# Patient Record
Sex: Male | Born: 1965 | Race: White | Hispanic: No | Marital: Married | Smoking: Current every day smoker
Health system: Southern US, Community
[De-identification: ages and names within clinical notes are randomized; demographics above are authoritative.]

## PROBLEM LIST (undated history)

## (undated) DIAGNOSIS — K635 Polyp of colon: Secondary | ICD-10-CM

## (undated) DIAGNOSIS — E785 Hyperlipidemia, unspecified: Secondary | ICD-10-CM

## (undated) DIAGNOSIS — N410 Acute prostatitis: Secondary | ICD-10-CM

## (undated) DIAGNOSIS — K648 Other hemorrhoids: Secondary | ICD-10-CM

## (undated) DIAGNOSIS — G5603 Carpal tunnel syndrome, bilateral upper limbs: Secondary | ICD-10-CM

## (undated) DIAGNOSIS — M1990 Unspecified osteoarthritis, unspecified site: Secondary | ICD-10-CM

## (undated) DIAGNOSIS — G43909 Migraine, unspecified, not intractable, without status migrainosus: Secondary | ICD-10-CM

## (undated) DIAGNOSIS — T7840XA Allergy, unspecified, initial encounter: Secondary | ICD-10-CM

## (undated) DIAGNOSIS — J189 Pneumonia, unspecified organism: Secondary | ICD-10-CM

## (undated) HISTORY — DX: Pneumonia, unspecified organism: J18.9

## (undated) HISTORY — DX: Hyperlipidemia, unspecified: E78.5

## (undated) HISTORY — DX: Allergy, unspecified, initial encounter: T78.40XA

## (undated) HISTORY — DX: Acute prostatitis: N41.0

## (undated) HISTORY — PX: SHOULDER OPEN ROTATOR CUFF REPAIR: SHX2407

## (undated) HISTORY — DX: Other hemorrhoids: K64.8

## (undated) HISTORY — PX: SPINE SURGERY: SHX786

## (undated) HISTORY — PX: APPENDECTOMY: SHX54

## (undated) HISTORY — PX: CARPAL TUNNEL WITH CUBITAL TUNNEL: SHX5608

## (undated) HISTORY — PX: TONSILLECTOMY: SUR1361

## (undated) HISTORY — DX: Unspecified osteoarthritis, unspecified site: M19.90

## (undated) HISTORY — DX: Polyp of colon: K63.5

---
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
## 1989-03-17 HISTORY — PX: SHOULDER OPEN ROTATOR CUFF REPAIR: SHX2407

## 1998-10-18 ENCOUNTER — Ambulatory Visit (HOSPITAL_COMMUNITY): Admission: RE | Admit: 1998-10-18 | Discharge: 1998-10-18 | Payer: Self-pay | Admitting: Orthopedic Surgery

## 1998-10-18 ENCOUNTER — Encounter: Payer: Self-pay | Admitting: Orthopedic Surgery

## 2000-08-20 ENCOUNTER — Ambulatory Visit (HOSPITAL_BASED_OUTPATIENT_CLINIC_OR_DEPARTMENT_OTHER): Admission: RE | Admit: 2000-08-20 | Discharge: 2000-08-20 | Payer: Self-pay | Admitting: Oral Surgery

## 2000-08-20 ENCOUNTER — Encounter (INDEPENDENT_AMBULATORY_CARE_PROVIDER_SITE_OTHER): Payer: Self-pay | Admitting: *Deleted

## 2002-03-04 ENCOUNTER — Encounter: Admission: RE | Admit: 2002-03-04 | Discharge: 2002-03-04 | Payer: Self-pay | Admitting: Orthopedic Surgery

## 2002-03-04 ENCOUNTER — Encounter: Payer: Self-pay | Admitting: Orthopedic Surgery

## 2002-03-23 ENCOUNTER — Emergency Department (HOSPITAL_COMMUNITY): Admission: EM | Admit: 2002-03-23 | Discharge: 2002-03-23 | Payer: Self-pay | Admitting: Emergency Medicine

## 2002-03-23 ENCOUNTER — Encounter: Payer: Self-pay | Admitting: Emergency Medicine

## 2004-06-28 ENCOUNTER — Ambulatory Visit: Payer: Self-pay | Admitting: Internal Medicine

## 2005-07-01 ENCOUNTER — Emergency Department (HOSPITAL_COMMUNITY): Admission: EM | Admit: 2005-07-01 | Discharge: 2005-07-01 | Payer: Self-pay | Admitting: Family Medicine

## 2006-03-16 ENCOUNTER — Ambulatory Visit: Payer: Self-pay | Admitting: Internal Medicine

## 2006-03-16 ENCOUNTER — Encounter: Admission: RE | Admit: 2006-03-16 | Discharge: 2006-03-16 | Payer: Self-pay | Admitting: Internal Medicine

## 2006-11-22 ENCOUNTER — Ambulatory Visit: Payer: Self-pay | Admitting: Internal Medicine

## 2007-02-26 ENCOUNTER — Ambulatory Visit: Payer: Self-pay | Admitting: Internal Medicine

## 2007-02-26 LAB — CONVERTED CEMR LAB
ALT: 31 units/L (ref 0–53)
Bilirubin Urine: NEGATIVE
Bilirubin, Direct: 0.1 mg/dL (ref 0.0–0.3)
Calcium: 9.2 mg/dL (ref 8.4–10.5)
Cholesterol: 229 mg/dL (ref 0–200)
Direct LDL: 131.4 mg/dL
Eosinophils Absolute: 0.3 10*3/uL (ref 0.0–0.6)
Eosinophils Relative: 3.9 % (ref 0.0–5.0)
GFR calc Af Amer: 120 mL/min
GFR calc non Af Amer: 99 mL/min
Glucose, Bld: 90 mg/dL (ref 70–99)
HDL: 33.3 mg/dL — ABNORMAL LOW (ref >39.0)
Ketones, urine, test strip: NEGATIVE
Lymphocytes Relative: 18.2 % (ref 12.0–46.0)
MCV: 98.6 fL (ref 78.0–100.0)
Monocytes Relative: 8.3 % (ref 3.0–11.0)
Neutro Abs: 6 10*3/uL (ref 1.4–7.7)
Platelets: 270 10*3/uL (ref 150–400)
Specific Gravity, Urine: 1.01
TSH: 1.22 microintl units/mL (ref 0.35–5.50)
pH: 7.5

## 2007-02-27 DIAGNOSIS — J309 Allergic rhinitis, unspecified: Secondary | ICD-10-CM

## 2007-03-28 ENCOUNTER — Ambulatory Visit: Payer: Self-pay | Admitting: Internal Medicine

## 2007-03-28 DIAGNOSIS — K219 Gastro-esophageal reflux disease without esophagitis: Secondary | ICD-10-CM

## 2007-03-28 DIAGNOSIS — N410 Acute prostatitis: Secondary | ICD-10-CM | POA: Insufficient documentation

## 2007-03-28 DIAGNOSIS — K648 Other hemorrhoids: Secondary | ICD-10-CM | POA: Insufficient documentation

## 2007-03-28 DIAGNOSIS — M1990 Unspecified osteoarthritis, unspecified site: Secondary | ICD-10-CM | POA: Insufficient documentation

## 2007-03-28 HISTORY — DX: Other hemorrhoids: K64.8

## 2007-03-28 HISTORY — DX: Acute prostatitis: N41.0

## 2007-05-31 ENCOUNTER — Ambulatory Visit: Payer: Self-pay | Admitting: Internal Medicine

## 2007-06-03 ENCOUNTER — Ambulatory Visit: Payer: Self-pay | Admitting: Internal Medicine

## 2007-06-03 DIAGNOSIS — G4700 Insomnia, unspecified: Secondary | ICD-10-CM

## 2007-07-09 ENCOUNTER — Ambulatory Visit: Payer: Self-pay | Admitting: Internal Medicine

## 2007-07-09 DIAGNOSIS — L723 Sebaceous cyst: Secondary | ICD-10-CM

## 2007-09-12 ENCOUNTER — Ambulatory Visit: Payer: Self-pay | Admitting: Internal Medicine

## 2007-10-15 ENCOUNTER — Ambulatory Visit: Payer: Self-pay | Admitting: Internal Medicine

## 2007-10-15 DIAGNOSIS — J0100 Acute maxillary sinusitis, unspecified: Secondary | ICD-10-CM | POA: Insufficient documentation

## 2007-10-28 ENCOUNTER — Telehealth: Payer: Self-pay | Admitting: Internal Medicine

## 2007-12-20 ENCOUNTER — Ambulatory Visit: Payer: Self-pay | Admitting: Internal Medicine

## 2008-02-19 ENCOUNTER — Ambulatory Visit: Payer: Self-pay | Admitting: Internal Medicine

## 2008-05-20 ENCOUNTER — Ambulatory Visit: Payer: Self-pay | Admitting: Internal Medicine

## 2008-07-07 ENCOUNTER — Telehealth: Payer: Self-pay | Admitting: Internal Medicine

## 2008-07-14 ENCOUNTER — Ambulatory Visit: Payer: Self-pay | Admitting: Family Medicine

## 2008-07-17 DIAGNOSIS — G43909 Migraine, unspecified, not intractable, without status migrainosus: Secondary | ICD-10-CM

## 2008-07-17 HISTORY — DX: Migraine, unspecified, not intractable, without status migrainosus: G43.909

## 2009-02-22 ENCOUNTER — Observation Stay (HOSPITAL_COMMUNITY): Admission: EM | Admit: 2009-02-22 | Discharge: 2009-02-23 | Payer: Self-pay | Admitting: Neurology

## 2009-02-22 ENCOUNTER — Encounter: Payer: Self-pay | Admitting: Emergency Medicine

## 2009-02-23 ENCOUNTER — Telehealth: Payer: Self-pay | Admitting: *Deleted

## 2009-02-24 ENCOUNTER — Telehealth: Payer: Self-pay | Admitting: Internal Medicine

## 2009-05-17 ENCOUNTER — Telehealth: Payer: Self-pay | Admitting: Internal Medicine

## 2009-05-18 ENCOUNTER — Ambulatory Visit: Payer: Self-pay | Admitting: Internal Medicine

## 2009-05-18 LAB — CONVERTED CEMR LAB
BUN: 2 mg/dL — ABNORMAL LOW (ref 6–23)
Bilirubin Urine: NEGATIVE
Bilirubin, Direct: 0.1 mg/dL (ref 0.0–0.3)
CO2: 26 meq/L (ref 19–32)
Chloride: 98 meq/L (ref 96–112)
Cholesterol: 232 mg/dL — ABNORMAL HIGH (ref 0–200)
Creatinine, Ser: 0.8 mg/dL (ref 0.4–1.5)
Direct LDL: 157.1 mg/dL
Eosinophils Absolute: 0.2 10*3/uL (ref 0.0–0.7)
Glucose, Bld: 76 mg/dL (ref 70–99)
Glucose, Urine, Semiquant: NEGATIVE
HCT: 45 % (ref 39.0–52.0)
Lymphs Abs: 1.5 10*3/uL (ref 0.7–4.0)
MCHC: 35.9 g/dL (ref 30.0–36.0)
MCV: 102.4 fL — ABNORMAL HIGH (ref 78.0–100.0)
Monocytes Absolute: 0.7 10*3/uL (ref 0.1–1.0)
Neutrophils Relative %: 68.5 % (ref 43.0–77.0)
Platelets: 210 10*3/uL (ref 150.0–400.0)
Protein, U semiquant: NEGATIVE
Specific Gravity, Urine: 1.01
TSH: 0.95 microintl units/mL (ref 0.35–5.50)
Total Bilirubin: 0.9 mg/dL (ref 0.3–1.2)
Total Protein: 7.2 g/dL (ref 6.0–8.3)
pH: 7

## 2009-05-24 ENCOUNTER — Ambulatory Visit: Payer: Self-pay | Admitting: Internal Medicine

## 2009-05-24 DIAGNOSIS — R000 Tachycardia, unspecified: Secondary | ICD-10-CM

## 2009-05-24 DIAGNOSIS — R519 Headache, unspecified: Secondary | ICD-10-CM | POA: Insufficient documentation

## 2009-05-24 DIAGNOSIS — R51 Headache: Secondary | ICD-10-CM

## 2009-05-24 DIAGNOSIS — E785 Hyperlipidemia, unspecified: Secondary | ICD-10-CM | POA: Insufficient documentation

## 2009-06-22 ENCOUNTER — Telehealth: Payer: Self-pay | Admitting: Internal Medicine

## 2009-08-19 ENCOUNTER — Ambulatory Visit: Payer: Self-pay | Admitting: Internal Medicine

## 2009-08-19 LAB — CONVERTED CEMR LAB
ALT: 54 units/L — ABNORMAL HIGH (ref 0–53)
Albumin: 4.4 g/dL (ref 3.5–5.2)
Alkaline Phosphatase: 50 units/L (ref 39–117)
Cholesterol: 219 mg/dL — ABNORMAL HIGH (ref 0–200)
HDL: 57.8 mg/dL (ref >39.00)
Total Protein: 7.2 g/dL (ref 6.0–8.3)
VLDL: 19 mg/dL (ref 0.0–40.0)

## 2009-08-26 ENCOUNTER — Ambulatory Visit: Payer: Self-pay | Admitting: Internal Medicine

## 2009-08-26 LAB — CONVERTED CEMR LAB: LDL Goal: 160 mg/dL

## 2009-09-29 ENCOUNTER — Ambulatory Visit: Payer: Self-pay | Admitting: Internal Medicine

## 2009-12-07 ENCOUNTER — Ambulatory Visit: Payer: Self-pay | Admitting: Internal Medicine

## 2009-12-07 DIAGNOSIS — R61 Generalized hyperhidrosis: Secondary | ICD-10-CM

## 2009-12-21 LAB — CONVERTED CEMR LAB
Basophils Absolute: 0 10*3/uL (ref 0.0–0.1)
Basophils Relative: 0.3 % (ref 0.0–3.0)
Eosinophils Absolute: 0.1 10*3/uL (ref 0.0–0.7)
HCT: 44.8 % (ref 39.0–52.0)
Hemoglobin: 16 g/dL (ref 13.0–17.0)
Lymphocytes Relative: 15.1 % (ref 12.0–46.0)
Lymphs Abs: 1.6 10*3/uL (ref 0.7–4.0)
MCHC: 35.6 g/dL (ref 30.0–36.0)
MCV: 99.6 fL (ref 78.0–100.0)
Monocytes Absolute: 0.7 10*3/uL (ref 0.1–1.0)
Neutro Abs: 8.4 10*3/uL — ABNORMAL HIGH (ref 1.4–7.7)
Platelets: 251 10*3/uL (ref 150.0–400.0)
RBC: 4.5 M/uL (ref 4.22–5.81)
RDW: 12.8 % (ref 11.5–14.6)

## 2009-12-23 ENCOUNTER — Ambulatory Visit: Payer: Self-pay | Admitting: Psychology

## 2009-12-30 ENCOUNTER — Ambulatory Visit: Payer: Self-pay | Admitting: Psychology

## 2010-03-08 ENCOUNTER — Ambulatory Visit: Payer: Self-pay | Admitting: Internal Medicine

## 2010-03-08 DIAGNOSIS — M1289 Other specific arthropathies, not elsewhere classified, multiple sites: Secondary | ICD-10-CM

## 2010-03-08 LAB — CONVERTED CEMR LAB
Basophils Absolute: 0 10*3/uL (ref 0.0–0.1)
Chlamydia, Swab/Urine, PCR: NEGATIVE
HCT: 45 % (ref 39.0–52.0)
Hemoglobin: 15.9 g/dL (ref 13.0–17.0)
Lymphs Abs: 1.8 10*3/uL (ref 0.7–4.0)
MCHC: 35.4 g/dL (ref 30.0–36.0)
MCV: 98.6 fL (ref 78.0–100.0)
Monocytes Absolute: 0.6 10*3/uL (ref 0.1–1.0)
Monocytes Relative: 5.9 % (ref 3.0–12.0)
Neutro Abs: 7.7 10*3/uL (ref 1.4–7.7)
Platelets: 261 10*3/uL (ref 150.0–400.0)
RDW: 13.5 % (ref 11.5–14.6)

## 2010-03-09 ENCOUNTER — Telehealth: Payer: Self-pay | Admitting: Internal Medicine

## 2010-03-14 ENCOUNTER — Telehealth: Payer: Self-pay | Admitting: Internal Medicine

## 2010-06-16 ENCOUNTER — Ambulatory Visit: Payer: Self-pay | Admitting: Psychology

## 2010-06-23 ENCOUNTER — Ambulatory Visit: Payer: Self-pay | Admitting: Internal Medicine

## 2010-06-29 ENCOUNTER — Telehealth: Payer: Self-pay | Admitting: Internal Medicine

## 2010-06-29 ENCOUNTER — Ambulatory Visit: Payer: Self-pay | Admitting: Psychology

## 2010-07-14 ENCOUNTER — Ambulatory Visit: Payer: Self-pay | Admitting: Psychology

## 2010-07-21 ENCOUNTER — Ambulatory Visit
Admission: RE | Admit: 2010-07-21 | Discharge: 2010-07-21 | Payer: Self-pay | Source: Home / Self Care | Attending: Internal Medicine | Admitting: Internal Medicine

## 2010-08-16 NOTE — Assessment & Plan Note (Signed)
Summary: 2 MONTH ROV/NJR/PT RSC FROM BMP/CJR   Vital Signs:  Patient profile:   45 year old male Height:      70 inches Weight:      178 pounds BMI:     25.63 Temp:     98.2 degrees F oral Pulse rate:   76 / minute Resp:     14 per minute BP sitting:   130 / 84  (left arm)  Vitals Entered By: Willy Eddy, LPN (Dec 07, 2009 11:20 AM) CC: roa-increased crestor to 2 times a week and started folbee last ov   CC:  roa-increased crestor to 2 times a week and started folbee last ov.  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 Once Daily 2)  Diazepam 10 Mg  Tabs (Diazepam) .... One Po Q Hs 3)  Mirtazapine 30 Mg Tabs (Mirtazapine) .... One By Mouth Daily  At Bedtime For  Sleep 4)  Crestor 20 Mg Tabs (Rosuvastatin Calcium) .... One By Mouth Monday Nights and Thursday Night 5)  Cialis 5 Mg Tabs (Tadalafil) .... One By Mouth Daily As Needed 6)  Verapamil Hcl Cr 240 Mg Xr24h-Cap (Verapamil Hcl) .... One By Mouth Daily 7)  Folbee 2.5-25-1 Mg Tabs  (Folic Acid-Vit B6-Vit B12) .... One By Mouth Daily 8)  Levitra 20 Mg Tabs (Vardenafil Hcl) .... One By Mouth As Directed  Allergies (verified): No Known Drug Allergies  Past History:  Family History: Last updated: 02/27/2007 Family History Other cancer-breast,cervical  Social History: Last updated: 03/28/2007 Occupation:contruction Single Current Smoker  Risk Factors: Smoking Status: current (12/07/2009) Packs/Day: 2.0 (12/07/2009)  Past medical, surgical, family and social histories (including risk factors) reviewed, and no changes noted (except as noted below).  Past Medical History: Reviewed history from 03/28/2007 and no changes required. Allergic rhinitis GERD hemmorhiods smoker Osteoarthritis left rotator cuff  Past Surgical History: Reviewed history from 02/27/2007 and no changes required. Appendectomy Rotator cuff repair  Family History: Reviewed history from 02/27/2007 and no changes required. Family History Other cancer-breast,cervical  Social History: Reviewed history from 03/28/2007 and no changes required. Occupation:contruction Single Current Smoker  Review of Systems  The patient denies anorexia, fever, weight loss, weight gain, vision loss, decreased hearing, hoarseness, chest pain, syncope, dyspnea on exertion, peripheral edema, prolonged cough, headaches, hemoptysis, abdominal pain, melena, hematochezia, severe indigestion/heartburn, hematuria, incontinence, genital sores, muscle weakness, suspicious skin lesions, transient blindness, difficulty walking, depression, unusual weight change, abnormal bleeding, enlarged lymph nodes, angioedema, breast masses, and testicular masses.    Physical Exam  General:  Well-developed,well-nourished,in no acute distress;  alert,appropriate and cooperative throughout examination Head:  Normocephalic and atraumatic without obvious abnormalities. No apparent alopecia or balding. Eyes:  pupils equal and pupils  round.   Ears:  R ear normal and L ear normal.   Nose:  External nasal examination shows no deformity or inflammation. Nasal mucosa are pink and moist without lesions or exudates. Mouth:  good dentition and pharynx pink and moist.   Neck:  No deformities, masses, or tenderness noted. Lungs:  normal respiratory effort and no wheezes.   Heart:  normal rate and regular rhythm.   Abdomen:  soft and non-tender.     Impression & Recommendations:  Problem # 1:  GENERALIZED HYPERHIDROSIS (ICD-780.8)  hold / remove the folbee  Orders: Venipuncture (36644) TLB-CBC Platelet - w/Differential (85025-CBCD) TLB-TSH (Thyroid Stimulating Hormone) (84443-TSH) TLB-T4 (Thyrox), Free (84439-FT4R) TLB-T3, Free (Triiodothyronine) (84481-T3FREE)  Problem # 2:  CLUSTER HEADACHE SYNDROME UNSPECIFIED (ICD-339.00) on verapamil protocol  the intensity has greaqtly reduced  Problem # 3:  PREVENTIVE HEALTH CARE (ICD-V70.0)  stop smoking  Td Booster: Tdap (05/24/2009)   Chol: 219 (08/19/2009)   HDL: 57.80 (08/19/2009)   LDL: DEL (02/26/2007)   TG: 95.0 (08/19/2009) TSH: 0.95 (05/18/2009)    Discussed using sunscreen, use of alcohol, drug use, self testicular exam, routine dental care, routine eye care, routine physical exam, seat belts, multiple vitamins, osteoporosis prevention, adequate calcium intake in diet, and recommendations for immunizations.  Discussed exercise and checking cholesterol.  Discussed gun safety, safe sex, and contraception. Also recommend checking PSA.  Complete Medication List: 1)  Multivitamins Caps (Multiple vitamin) .... Once daily 2)  Diazepam 10 Mg Tabs (Diazepam) .... One po q hs 3)  Mirtazapine 30 Mg Tabs (Mirtazapine) .... One by mouth daily  at bedtime for  sleep 4)  Crestor 20 Mg Tabs (Rosuvastatin calcium) .... One by mouth monday nights and thursday night 5)  Cialis 5 Mg Tabs (Tadalafil) .... One by mouth daily as needed 6)  Verapamil Hcl Cr 240 Mg Xr24h-cap (Verapamil  hcl) .... One by mouth daily 7)  Levitra 20 Mg Tabs (Vardenafil hcl) .... One by mouth as directed  Patient Instructions: 1)  Please schedule a follow-up appointment in 3 months.

## 2010-08-16 NOTE — Assessment & Plan Note (Signed)
Summary: 3 MONTH ROV/NJR   Vital Signs:  Patient profile:   45 year old male Height:      70 inches Weight:      175 pounds BMI:     25.20 Temp:     98.2 degrees F oral Pulse rate:   80 / minute Resp:     14 per minute BP sitting:   120 / 80  (left arm)  Vitals Entered By: Willy Eddy, LPN (March 08, 2010 2:09 PM)  Nutrition Counseling: Patient's BMI is greater than 25 and therefore counseled on weight management options. CC: roa- new issues to discuss with dr Lovell Sheehan Is Patient Diabetic? No   CC:  roa- new issues to discuss with dr Lovell Sheehan.  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 Once Daily 2)  Diazepam 10 Mg  Tabs (Diazepam) .... One Po Q Hs 3)  Mirtazapine 30 Mg Tabs (Mirtazapine) .... One By Mouth Daily  At Bedtime For  Sleep 4)  Crestor 20 Mg Tabs (Rosuvastatin Calcium) .... One By Mouth Monday Nights and Thursday Night 5)  Cialis 5 Mg Tabs  (Tadalafil) .... One By Mouth Daily As Needed 6)  Verapamil Hcl Cr 240 Mg Xr24h-Cap (Verapamil Hcl) .... One By Mouth Daily 7)  Levitra 20 Mg Tabs (Vardenafil Hcl) .... One By Mouth As Directed  Allergies (verified): No Known Drug Allergies  Past History:  Family History: Last updated: 02/27/2007 Family History Other cancer-breast,cervical  Social History: Last updated: 03/28/2007 Occupation:contruction Single Current Smoker  Risk Factors: Smoking Status: current (03/08/2010) Packs/Day: 2.0 (03/08/2010)  Past medical, surgical, family and social histories (including risk factors) reviewed, and no changes noted (except as noted below).  Past Medical History: Reviewed history from 03/28/2007 and no changes required. Allergic rhinitis GERD hemmorhiods smoker Osteoarthritis left rotator cuff  Past Surgical History: Reviewed history from 02/27/2007 and no changes required. Appendectomy Rotator cuff repair  Family History: Reviewed history from 02/27/2007 and no changes required. Family History Other cancer-breast,cervical  Social History: Reviewed history from 03/28/2007 and no changes required. Occupation:contruction Single Current Smoker  Review of Systems  The patient denies anorexia, fever, weight loss, weight gain, vision loss, decreased hearing, hoarseness, chest pain, syncope, dyspnea on exertion, peripheral edema, prolonged cough, headaches, hemoptysis, abdominal pain, melena, hematochezia, severe indigestion/heartburn, hematuria, incontinence, genital sores, muscle weakness, suspicious skin lesions, transient blindness, difficulty walking, depression, unusual weight change, abnormal bleeding, enlarged lymph nodes, angioedema, and breast masses.    Physical Exam  General:  Well-developed,well-nourished,in  no acute distress; alert,appropriate and cooperative throughout examination Head:  Normocephalic and atraumatic without obvious abnormalities. No apparent  alopecia or balding. Eyes:  pupils equal and pupils round.   Ears:  R ear normal and L ear normal.   Nose:  External nasal examination shows no deformity or inflammation. Nasal mucosa are pink and moist without lesions or exudates. Mouth:  good dentition and pharynx pink and moist.   Neck:  No deformities, masses, or tenderness noted. Lungs:  normal respiratory effort and no wheezes.   Heart:  normal rate and regular rhythm.     Impression & Recommendations:  Problem # 1:  OTHER SPECIFIED ARTHROPATHY MULTIPLE SITES (ICD-716.89) Assessment Unchanged  multijoint arthritis possible infectious ( had mild urinary symptoms) Bechets? Lymes? virus? toxic  Orders: TLB-CBC Platelet - w/Differential (85025-CBCD) T-Chlamydia  Probe, urine [phone]) T-Lyme Disease [id number]) T- * Misc. Laboratory test [phone])  Problem # 2:  CLUSTER HEADACHE SYNDROME UNSPECIFIED (ICD-339.00) Assessment: Unchanged stable pattern  Problem # 3:  ALLERGIC RHINITIS (ICD-477.9)  Discussed use of allergy medications and environmental measures.   Complete Medication List: 1)  Multivitamins Caps (Multiple vitamin) .... Once daily 2)  Diazepam 10 Mg Tabs (Diazepam) .... One po q hs 3)  Mirtazapine 30 Mg Tabs (Mirtazapine) .... One by mouth daily  at bedtime for  sleep 4)  Crestor 20 Mg Tabs (Rosuvastatin calcium) .... One by mouth monday nights and thursday night 5)  Cialis 5 Mg Tabs (Tadalafil) .... One by mouth daily as needed 6)  Verapamil Hcl Cr 240 Mg Xr24h-cap (Verapamil hcl) .... One by mouth daily 7)  Levitra 20 Mg Tabs (Vardenafil hcl) .... One by mouth as directed 8)  Doxycycline Hyclate 100 Mg Caps (Doxycycline hyclate) .... One by mouth two times a day 9)  Prednisone (pak) 10 Mg Tabs (Prednisone) .... Take as directed  Patient Instructions: 1)  Please schedule a follow-up appointment in 2 months. Prescriptions: PREDNISONE (PAK) 10 MG TABS (PREDNISONE) take as directed  #1 pk x 0    Entered and Authorized by:   Stacie Glaze MD   Signed by:   Stacie Glaze MD on 03/08/2010   Method used:   Electronically to        [facility]. [phone]* (retail)       [address]       [place], [place]  [address]       Ph: [phone]       Fax: [phone]   RxID:   [id number] DOXYCYCLINE HYCLATE 100 MG CAPS (DOXYCYCLINE HYCLATE) one by mouth two times a day  #42 x 0   Entered and Authorized by:   Stacie Glaze MD   Signed by:   Stacie Glaze MD on 03/08/2010   Method used:   Electronically to        [facility]. [phone]* (retail)       [address]       [place], [place]  [address]       Ph: [phone]       Fax: [phone]   RxID:   [id number]   Appended Document: Orders Update    Clinical Lists Changes  Orders: Added new Service order of Venipuncture (76283) - Signed Added new Service order of Specimen Handling (15176) - Signed

## 2010-08-16 NOTE — Assessment & Plan Note (Signed)
Summary: 3 MTH F/U // RS   Vital Signs:  Patient profile:   45 year old male Height:      70 inches Weight:      176 pounds BMI:     25.34 Temp:     98.2 degrees F oral Pulse rate:   76 / minute Resp:     14 per minute BP sitting:   110 / 70  (left arm)  Vitals Entered By: Willy Eddy, LPN (June 23, 2010 9:34 AM) CC: roa, URI symptoms Is Patient Diabetic? No   Primary Care Provider:  Stacie Glaze MD  CC:  roa and URI symptoms.  History of Present Illness: Has a sinus and chest cold thta has lasted r over one week and is concerned for bronchitis  URI Symptoms      This is a 45 year old man who presents with URI symptoms.  The patient reports nasal congestion, clear nasal discharge, and productive cough, but denies purulent nasal discharge, sore throat, dry cough, earache, and sick contacts.  The patient denies fever, low-grade fever (<100.5 degrees), fever of 100.5-103 degrees, fever of 103.1-104 degrees, fever to >104 degrees, stiff neck, dyspnea, wheezing, rash, vomiting, diarrhea, use of an antipyretic, and response to antipyretic.  The patient also reports itchy watery eyes, sneezing, and headache.    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) 6)  Acute Bronchitis  (ICD-466.0) 7)  Acute Maxillary Sinusitis  (ICD-461.0) 8)  Sebaceous Cyst, Infected  (ICD-706.2) 9)  Insomnia, Chronic  (ICD-307.42) 10)  Prostatitis, Acute, Chronic  (ICD-601.0) 11)  Osteoarthritis  (ICD-715.90) 12)  Preventive Health Care  (ICD-V70.0) 13)  Gerd  (ICD-530.81) 14)  Hemorrhoids,  Internal  (ICD-455.0) 15)  Allergic Rhinitis  (ICD-477.9)  Current Problems (verified): 1)  Other Specified Arthropathy Multiple Sites  (ICD-716.89) 2)  Generalized Hyperhidrosis  (ICD-780.8) 3)  Tachycardia  (ICD-785.0) 4)  Cluster Headache Syndrome Unspecified  (ICD-339.00) 5)  Hyperlipidemia, 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 Once Daily 2)  Diazepam 10 Mg  Tabs (Diazepam) .... One Po Q Hs 3)  Mirtazapine 30 Mg Tabs (Mirtazapine) .... One By Mouth Daily  At Bedtime For  Sleep 4)  Crestor 20 Mg Tabs (Rosuvastatin Calcium) .... One By Mouth Monday Nights and Thursday Night 5)  Cialis 5 Mg Tabs (Tadalafil) .... One By Mouth Daily As Needed 6)  Verapamil Hcl Cr 240 Mg Xr24h-Cap (Verapamil Hcl) .... One By Mouth Daily 7)  Levitra 20 Mg Tabs (Vardenafil Hcl) .... One By Mouth As Directed 8)  Doxycycline Hyclate 100 Mg Caps (Doxycycline Hyclate) .... One By Mouth Two Times A Day 9)  Prednisone (Pak) 10 Mg Tabs (Prednisone) .... Take As Directed  Current Medications (verified): 1)  Multivitamins   Caps (Multiple Vitamin) .... Once Daily 2)  Diazepam 10 Mg  Tabs (Diazepam) .... One Po Q Hs 3)  Mirtazapine 30 Mg Tabs (Mirtazapine) .... One By Mouth Daily  At Bedtime For  Sleep 4)  Crestor 20 Mg Tabs (Rosuvastatin Calcium) .... One By Mouth Monday Nights and Thursday Night 5)  Cialis 5 Mg Tabs (Tadalafil) .... One By Mouth Daily As Needed 6)  Levitra 20 Mg Tabs (Vardenafil Hcl) .... 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  Past Medical History: Reviewed history from 03/28/2007 and no changes required. Allergic rhinitis GERD hemmorhiods smoker Osteoarthritis left rotator cuff  Past Surgical History: Reviewed history from 02/27/2007 and no changes required. Appendectomy Rotator cuff repair  Family History: Reviewed history from 02/27/2007 and no changes required. Family History Other cancer-breast,cervical  Social History: Reviewed history from 03/28/2007 and no changes required. Occupation:contruction Single Current Smoker  Review of Systems  The patient denies anorexia, fever, weight loss, weight gain, vision loss, decreased hearing, hoarseness, chest pain, syncope, dyspnea on exertion, peripheral edema, prolonged cough, headaches, hemoptysis, abdominal pain, melena, hematochezia, severe indigestion/heartburn, hematuria, incontinence, genital sores, muscle weakness, suspicious skin lesions, transient blindness, difficulty walking, depression, unusual weight change, abnormal bleeding, enlarged lymph nodes, angioedema, and breast masses.    Physical Exam  General:  Well-developed,well-nourished,in no acute distress; alert,appropriate and cooperative throughout examination Head:  Normocephalic and atraumatic without obvious abnormalities. No apparent alopecia or balding. Eyes:  pupils equal and pupils round.   Ears:  R ear normal and L ear normal.   Nose:  External nasal examination shows no deformity or inflammation. Nasal mucosa are pink and moist without lesions or  exudates. Mouth:  good dentition and pharynx pink and moist.   Neck:  No deformities, masses, or tenderness noted. Lungs:  normal respiratory effort and R wheezes.   Heart:  normal rate and regular rhythm.   Abdomen:  soft and non-tender.   Msk:  normal ROM, no joint tenderness, and no joint swelling.   Extremities:  No clubbing, cyanosis, edema, or deformity noted with normal full range of motion of all joints.   Neurologic:  No cranial nerve deficits noted. Station and gait are normal. Plantar reflexes are down-going bilaterally. DTRs are symmetrical throughout. Sensory, motor and coordinative functions appear intact.   Impression & Recommendations:  Problem # 1:  CLUSTER HEADACHE SYNDROME UNSPECIFIED (ICD-339.00) stable pattern  Problem # 2:  HYPERLIPIDEMIA, TYPE IV (ICD-272.4)  His updated medication list for this problem includes:    Crestor 20 Mg Tabs (Rosuvastatin calcium) ..... One by mouth monday nights and thursday night  Labs Reviewed: SGOT: 28 (08/19/2009)   SGPT: 54 (08/19/2009)  Lipid Goals: Chol Goal: 200 (08/26/2009)   HDL Goal: 40 (08/26/2009)   LDL Goal: 160 (08/26/2009)   TG Goal: 150 (08/26/2009)  Prior 10 Yr Risk Heart Disease: Not enough information (08/26/2009)   HDL:57.80 (08/19/2009), 49.00 (05/18/2009)  LDL:DEL (02/26/2007)  Chol:219 (08/19/2009), 232 (05/18/2009)  Trig:95.0 (08/19/2009), 177.0 (05/18/2009)  Problem # 3:  ACUTE BRONCHITIS (ICD-466.0) Assessment: Deteriorated  recurrent  infections  has been sick for over 8 days i feel that this is near to pnemonia and definitely he has risk for copd   The following medications were removed from the medication list:    Doxycycline Hyclate 100 Mg Caps (Doxycycline hyclate) ..... One by mouth two times a day His updated medication list for this problem includes:    Amoxicillin-pot Clavulanate 875-125 Mg Tabs (Amoxicillin-pot clavulanate) ..... One by mouth two times a day    Bromatan Plus 45-3.5-30  Mg/42ml Susp (Pse tan-dexchlor tan-dm tan) .Marland Kitchen..Marland Kitchen Two tsp by mouth two times a day (may sub for similar)    Ventolin Hfa 108 (90 Base) Mcg/act Aers (Albuterol sulfate) .Marland Kitchen..Marland Kitchen Two puff twice a day or every 6 hours as needed  Take antibiotics and other medications as directed. Encouraged to push clear liquids, get enough rest, and take acetaminophen as needed. To be seen in 5-7 days if no improvement, sooner if worse.  Problem # 4:  GERD (ICD-530.81)  Labs Reviewed: Hgb: 15.9 (03/08/2010)   Hct: 45.0 (03/08/2010)  Complete Medication List: 1)  Multivitamins Caps (Multiple vitamin) .... Once daily 2)  Diazepam 10 Mg Tabs (Diazepam) .... One po q hs 3)  Mirtazapine 30 Mg Tabs (Mirtazapine) .... One by mouth daily  at bedtime for  sleep 4)  Crestor 20 Mg Tabs (Rosuvastatin calcium) .... One by mouth monday nights and thursday night 5)  Cialis 5 Mg Tabs (Tadalafil) .... One by mouth daily as needed 6)  Levitra 20 Mg Tabs (Vardenafil hcl) .... One by mouth as directed 7)  Amoxicillin-pot Clavulanate 875-125 Mg Tabs (Amoxicillin-pot clavulanate) .... One by mouth two times a day 8)  Bromatan Plus 45-3.5-30 Mg/3ml Susp (Pse tan-dexchlor tan-dm tan) .... Two tsp by mouth two times a day (may sub for similar) 9)  Ventolin Hfa 108 (90 Base) Mcg/act Aers (Albuterol sulfate) .... Two puff twice a day or every 6 hours as needed  Patient Instructions: 1)  Please schedule a follow-up appointment in 3 months. 2)  BMP prior to visit, ICD-9:  995.20 3)  Hepatic Panel prior to visit, ICD-9: 4)  Lipid Panel prior to visit, ICD-9:272.4 Prescriptions: BROMATAN PLUS 45-3.5-30 MG/5ML SUSP (PSE TAN-DEXCHLOR TAN-DM TAN) two tsp by mouth two times a day (may sub for similar)  #6 oz x 0   Entered and Authorized by:   Stacie Glaze MD   Signed by:   Stacie Glaze MD on 06/23/2010   Method used:   Electronically to        [facility]. [phone]* (retail)       [address]       [place], [place]  [address]       Ph: [phone]       Fax: [phone]   RxID:   [id number] AMOXICILLIN-POT CLAVULANATE 875-125 MG TABS (AMOXICILLIN-POT CLAVULANATE) one by mouth two times a day  #20 x 0   Entered and Authorized by:   Stacie Glaze MD   Signed by:   Stacie Glaze MD on 06/23/2010   Method used:   Electronically to        [facility]. [phone]* (retail)       [address]       [place], [place]  [address]       Ph: [phone]       Fax: [phone]  RxID:   [id number]    Orders Added: 1)  Est. Patient Level IV [14782]

## 2010-08-16 NOTE — Progress Notes (Signed)
Summary: Pt lost his Doxycycline. Pls call in new script  Phone Note Call from Patient Call back at [phone] cell   Caller: Patient Summary of Call: Pt called and said that he lost his bottle of Doxycycline. Pt can not find it anywhere. Pt req that a new scripts be called in asap to [facility] on [address].    Initial call taken by: Lucy Antigua,  March 09, 2010 3:59 PM    Prescriptions: DOXYCYCLINE HYCLATE 100 MG CAPS (DOXYCYCLINE HYCLATE) one by mouth two times a day  #42 x 0   Entered by:   Willy Eddy, LPN   Authorized by:   Stacie Glaze MD   Signed by:   Willy Eddy, LPN on 09/81/1914   Method used:   Electronically to        [facility]. [phone]* (retail)       [address]       [place], [place]  [address]       Ph: [phone]       Fax: [phone]   RxID:   [id number]

## 2010-08-16 NOTE — Assessment & Plan Note (Signed)
Summary: 3 month rov/njr [facility] BMP/NJR   Vital Signs:  Patient profile:   45 year old male Height:      70 inches Weight:      168 pounds BMI:     24.19 Temp:     98.2 degrees F oral Pulse rate:   80 / minute Resp:     14 per minute BP sitting:   124 / 70  (left arm)  Vitals Entered By: Willy Eddy, LPN (August 26, 2009 9:51 AM) CC: [facility], Lipid Management   CC:  [facility] and Lipid Management.  History of Present Illness: follow up  lipids increased patter on cluster head with hx  of hospitalizaton for severe head aches and cpomplete workup failed topramax and propranolol  Lipid Management History:      Positive NCEP/ATP III risk factors include current tobacco user.  Negative NCEP/ATP III risk factors include male age less than 53 years old.     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) 4)  Acute Bronchitis  (ICD-466.0) 5)  Acute Maxillary Sinusitis  (ICD-461.0) 6)  Sebaceous Cyst, Infected  (ICD-706.2) 7)  Insomnia, Chronic  (ICD-307.42) 8)  Prostatitis, Acute, Chronic  (ICD-601.0) 9)  Osteoarthritis  (ICD-715.90) 10)  Preventive Health Care  (ICD-V70.0) 11)  Gerd  (ICD-530.81) 12)  Hemorrhoids, Internal  (ICD-455.0) 13)  Allergic Rhinitis  (ICD-477.9)  Current Problems (verified): 1)  Tachycardia  (ICD-785.0) 2)  Cluster Headache Syndrome Unspecified  (ICD-339.00) 3)  Hyperlipidemia, 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 Once Daily 2)  Diazepam 10 Mg  Tabs (Diazepam) .... One Po Q Hs 3)  Mirtazapine 30 Mg Tabs (Mirtazapine) .... One By Mouth Daily  At Bedtime For  Sleep 4)  Naprosyn 500 Mg  Tabs (Naproxen) .... One By Mouth Q Hs 5)  Atenolol 25 Mg Tabs (Atenolol) .... One  By Mouth Daily  At Night 6)  Crestor 20 Mg Tabs (Rosuvastatin Calcium) .... One By Mouth Monday Nights 7)  Cialis 5 Mg Tabs (Tadalafil) .... One By Mouth Daily As Needed  Current Medications (verified): 1)  Multivitamins   Caps (Multiple Vitamin) .... Once Daily 2)  Diazepam 10 Mg  Tabs (Diazepam) .... One Po Q Hs 3)  Mirtazapine 30 Mg Tabs (Mirtazapine) .... One By Mouth Daily  At Bedtime For  Sleep 4)  Crestor 20 Mg Tabs (Rosuvastatin Calcium) .... One By Mouth Monday Nights and Thursday Night 5)  Cialis 5 Mg Tabs (Tadalafil) .... One By Mouth Daily As Needed 6)  Verapamil Hcl Cr 180 Mg Cr-Tabs (Verapamil Hcl) .... One By Mouth Daily  Allergies (verified): No Known Drug Allergies  Past History:  Family History: Last updated: 02/27/2007 Family History Other cancer-breast,cervical  Social History: Last updated: 03/28/2007 Occupation:contruction Single Current Smoker  Risk Factors: Smoking Status:  current (08/26/2009) Packs/Day: 2.0 (08/26/2009)  Past medical, surgical, family and social histories (including risk factors) reviewed, and no changes noted (except as noted below).  Past Medical History: Reviewed history from 03/28/2007 and no changes required. Allergic rhinitis GERD hemmorhiods smoker Osteoarthritis left rotator cuff  Past Surgical History: Reviewed history from 02/27/2007 and no changes required. Appendectomy Rotator cuff repair  Family History: Reviewed history from 02/27/2007 and no changes required. Family History Other  cancer-breast,cervical  Social History: Reviewed history from 03/28/2007 and no changes required. Occupation:contruction Single Current Smoker  Review of Systems  The patient denies anorexia, fever, weight loss, weight gain, vision loss, decreased hearing, hoarseness, chest pain, syncope, dyspnea on exertion, peripheral edema, prolonged cough, headaches, hemoptysis, abdominal pain, melena, hematochezia, severe indigestion/heartburn, hematuria, incontinence, genital sores, muscle weakness, suspicious skin lesions, transient blindness, difficulty walking, depression, unusual weight change, abnormal bleeding, enlarged lymph nodes, angioedema, and breast masses.    Physical Exam  General:  Well-developed,well-nourished,in no acute distress; alert,appropriate and cooperative throughout examination Head:  Normocephalic and atraumatic without obvious abnormalities. No apparent alopecia or balding. Eyes:  pupils equal and pupils round.   Ears:  R ear normal and L ear normal.   Nose:  External nasal examination shows no deformity or inflammation. Nasal mucosa are pink and moist without lesions or exudates. Mouth:  good dentition and pharynx pink and moist.   Neck:  No deformities, masses, or tenderness noted. Lungs:  normal respiratory effort and no wheezes.   Heart:  normal rate and regular rhythm.   Abdomen:  soft and non-tender.   Genitalia:  circumcised and no urethral discharge.   Prostate:  no gland enlargement and no nodules.   Msk:  normal ROM, no joint tenderness, and no joint swelling.   Pulses:  R radial normal and L radial normal.   Extremities:  No clubbing, cyanosis, edema, or deformity noted with normal full range of motion of all joints.   Neurologic:  No cranial nerve deficits noted. Station and gait are normal. Plantar reflexes are down-going bilaterally. DTRs are symmetrical throughout. Sensory, motor and coordinative functions appear intact.   Impression &  Recommendations:  Problem # 1:  HYPERLIPIDEMIA, TYPE IV (ICD-272.4)  His updated medication list for this problem includes:    Crestor 20 Mg Tabs (Rosuvastatin calcium) ..... One by mouth monday nights and thursday night  Labs Reviewed: SGOT: 28 (08/19/2009)   SGPT: 54 (08/19/2009)   HDL:57.80 (08/19/2009), 49.00 (05/18/2009)  LDL:DEL (02/26/2007)  Chol:219 (08/19/2009), 232 (05/18/2009)  Trig:95.0 (08/19/2009), 177.0 (05/18/2009)  Problem # 2:  CLUSTER HEADACHE SYNDROME UNSPECIFIED (ICD-339.00) increased head ache pattern due to stress coital head aches  see the discharge summary for the hopsital work up the pt had a trial of topramax and them a BB he hsa MRI and angio and even an LP for work up will try verapamil for cluster head ache per protocol  Complete Medication List: 1)  Multivitamins Caps (Multiple vitamin) .... Once daily 2)  Diazepam 10 Mg Tabs (Diazepam) .... One po q hs 3)  Mirtazapine 30 Mg Tabs (Mirtazapine) .... One by mouth daily  at bedtime for  sleep 4)  Crestor 20 Mg Tabs (Rosuvastatin calcium) .... One by mouth monday nights and thursday night 5)  Cialis 5 Mg Tabs (Tadalafil) .... One by mouth daily as needed 6)  Verapamil Hcl Cr 180 Mg Cr-tabs (Verapamil hcl) .... One by mouth daily  Lipid Assessment/Plan:      Based on NCEP/ATP III, the patient's risk  factor category is "0-1 risk factors".  The patient's lipid goals are as follows: Total cholesterol goal is 200; LDL cholesterol goal is 160; HDL cholesterol goal is 40; Triglyceride goal is 150.    Patient Instructions: 1)  Please schedule a follow-up appointment in 1 month 2)  verapamil in AM and the diazepam in the PM 1/2 to 1. Prescriptions: VERAPAMIL HCL CR 180 MG CR-TABS (VERAPAMIL HCL) one by mouth daily  #30 x 11   Entered and Authorized by:   Stacie Glaze MD   Signed by:   Stacie Glaze MD on 08/26/2009   Method used:   Electronically to        [facility]. [phone]* (retail)       [address]       [place], [place]  [address]       Ph: [phone]       Fax: [phone]   RxID:   [id number]

## 2010-08-16 NOTE — Assessment & Plan Note (Signed)
Summary: 1 month fup//ccm   Vital Signs:  Patient profile:   45 year old male Height:      70 inches Weight:      180 pounds BMI:     25.92 Temp:     98.3 degrees F oral Pulse rate:   76 / minute Resp:     14 per minute BP sitting:   132 / 80  (left arm)  Vitals Entered By: Willy Eddy, LPN (September 29, 2009 10:26 AM)  Nutrition Counseling: Patient's BMI is greater than 25 and therefore counseled on weight management options. CC: [facility]   CC:  [facility].  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 Once Daily 2)  Diazepam 10 Mg  Tabs (Diazepam) .... One Po Q Hs 3)  Mirtazapine 30 Mg Tabs (Mirtazapine) .... One By Mouth Daily  At Bedtime For  Sleep 4)  Crestor 20 Mg Tabs (Rosuvastatin Calcium) .... One By Mouth Monday Nights and Thursday Night 5)  Cialis 5 Mg Tabs (Tadalafil) .... One By Mouth Daily As Needed 6)  Verapamil Hcl Cr 180 Mg Cr-Tabs (Verapamil Hcl) .... One By Mouth Daily  Allergies (verified): No Known Drug Allergies  Past History:  Family History: Last updated: 02/27/2007 Family History Other cancer-breast,cervical  Social  History: Last updated: 03/28/2007 Occupation:contruction Single Current Smoker  Risk Factors: Smoking Status: current (09/29/2009) Packs/Day: 2.0 (09/29/2009)  Past medical, surgical, family and social histories (including risk factors) reviewed, and no changes noted (except as noted below).  Past Medical History: Reviewed history from 03/28/2007 and no changes required. Allergic rhinitis GERD hemmorhiods smoker Osteoarthritis left rotator cuff  Past Surgical History: Reviewed history from 02/27/2007 and no changes required. Appendectomy Rotator cuff repair  Family History: Reviewed history from 02/27/2007 and no changes required. Family History Other cancer-breast,cervical  Social History: Reviewed history from 03/28/2007 and no changes required. Occupation:contruction Single Current Smoker  Review of Systems  The patient denies anorexia, fever, weight loss, weight gain, vision loss, decreased hearing, hoarseness, chest pain, syncope, dyspnea on exertion, peripheral edema, prolonged cough, headaches, hemoptysis, abdominal pain, melena, hematochezia, severe indigestion/heartburn, hematuria, incontinence, genital sores, muscle weakness, suspicious skin lesions, transient blindness, difficulty walking, depression, unusual weight change, abnormal bleeding, enlarged lymph nodes, angioedema, and breast masses.    Physical Exam  General:  Well-developed,well-nourished,in no acute distress; alert,appropriate and cooperative throughout examination Head:  Normocephalic and atraumatic without obvious abnormalities. No apparent alopecia or balding. Eyes:  pupils equal and pupils round.   Ears:  R ear normal and L ear normal.   Nose:  External nasal examination shows no deformity or inflammation. Nasal mucosa are pink and moist without lesions or exudates. Mouth:  good dentition and  pharynx pink and moist.   Neck:  No deformities, masses, or tenderness noted. Lungs:  normal  respiratory effort and no wheezes.   Heart:  normal rate and regular rhythm.     Impression & Recommendations:  Problem # 1:  HYPERLIPIDEMIA, TYPE IV (ICD-272.4) add b6 b12 and folate for lipids His updated medication list for this problem includes:    Crestor 20 Mg Tabs (Rosuvastatin calcium) ..... One by mouth monday nights and thursday night  Labs Reviewed: SGOT: 28 (08/19/2009)   SGPT: 54 (08/19/2009)  Lipid Goals: Chol Goal: 200 (08/26/2009)   HDL Goal: 40 (08/26/2009)   LDL Goal: 160 (08/26/2009)   TG Goal: 150 (08/26/2009)  Prior 10 Yr Risk Heart Disease: Not enough information (08/26/2009)   HDL:57.80 (08/19/2009), 49.00 (05/18/2009)  LDL:DEL (02/26/2007)  Chol:219 (08/19/2009), 232 (05/18/2009)  Trig:95.0 (08/19/2009), 177.0 (05/18/2009)  Problem # 2:  CLUSTER HEADACHE SYNDROME UNSPECIFIED (ICD-339.00) much improved cluster head ache pattern  Problem # 3:  GERD (ICD-530.81)  Labs Reviewed: Hgb: 16.1 (05/18/2009)   Hct: 45.0 (05/18/2009)  Complete Medication List: 1)  Multivitamins Caps (Multiple vitamin) .... Once daily 2)  Diazepam 10 Mg Tabs (Diazepam) .... One po q hs 3)  Mirtazapine 30 Mg Tabs (Mirtazapine) .... One by mouth daily  at bedtime for  sleep 4)  Crestor 20 Mg Tabs (Rosuvastatin calcium) .... One by mouth monday nights and thursday night 5)  Cialis 5 Mg Tabs (Tadalafil) .... One by mouth daily as needed 6)  Verapamil Hcl Cr 240 Mg Xr24h-cap (Verapamil hcl) .... One by mouth daily 7)  Folbee 2.5-25-1 Mg Tabs (Folic acid-vit b6-vit b12) .... One by mouth daily 8)  Levitra 20 Mg Tabs (Vardenafil hcl) .... One by mouth as directed  Patient Instructions: 1)  Please schedule a follow-up appointment in 2 months. Prescriptions: LEVITRA 20 MG TABS (VARDENAFIL HCL) one by mouth as directed  #5 x 11   Entered and Authorized by:   Stacie Glaze MD   Signed by:   Stacie Glaze MD on 09/29/2009   Method used:   Electronically to        [facility].  [phone]* (retail)       [address]       [place], [place]  [address]       Ph: [phone]       Fax: [phone]   RxID:   [id number] VERAPAMIL HCL CR 240 MG XR24H-CAP (VERAPAMIL HCL) one by mouth daily  #30 x 11   Entered and Authorized by:   Stacie Glaze MD   Signed by:   Stacie Glaze MD on 09/29/2009   Method used:   Electronically to        [facility]. [phone]* (retail)       [address]       [place], [place]  [address]       Ph: [phone]       Fax: [phone]   RxID:   [id number] FOLBEE 2.5-25-1 MG TABS (FOLIC ACID-VIT B6-VIT B12) one by mouth daily  #30 x 11   Entered and Authorized by:   Stacie Glaze MD   Signed by:   Stacie Glaze MD on 09/29/2009   Method used:   Electronically to        [facility]. [phone]* (retail)  [address]       [place], [place]  [address]       Ph: [phone]       Fax: [phone]   RxID:   [id number]

## 2010-08-16 NOTE — Progress Notes (Signed)
Summary: bloodwork results  Phone Note Call from Patient Call back at Work Phone [phone]   Caller: Patient Call For: Stacie Glaze MD Summary of Call: pt would like bloodwork results Initial call taken by: Heron Sabins,  March 14, 2010 4:38 PM  Follow-up for Phone Call        pt informed-neg per dr Lovell Sheehan Follow-up by: Willy Eddy, LPN,  March 14, 2010 5:36 PM

## 2010-08-18 NOTE — Assessment & Plan Note (Signed)
Summary: chest congestion/njr   Vital Signs:  Patient profile:   45 year old male Pulse rate:   60 / minute BP sitting:   100 / 80  (left arm)  Vitals Entered By: Kyung Rudd, CMA (July 21, 2010 3:21 PM) CC: pt c/o not feeling any better after dr Lovell Sheehan rxed abx for walking pneumonia...also c/o fatigued   Primary Care Provider:  Stacie Glaze MD  CC:  pt c/o not feeling any better after dr Lovell Sheehan rxed abx for walking pneumonia...also c/o fatigued.  History of Present Illness: Patient presents to clinic as a workin for evaluation of possible sinusitis. States symptoms began over one month ago. C/o nasal and chest congestion with light yellow drainage and productive cough. Notes bilateral maxillary sinus pain but denies teeth pain. No f/c or sweats. S/p augmentin course with persistent symptoms.  Current Medications (verified): 1)  Multivitamins   Caps (Multiple Vitamin) .... Once Daily 2)  Diazepam 10 Mg  Tabs (Diazepam) .... One Po Q Hs 3)  Mirtazapine 30 Mg Tabs (Mirtazapine) .... One By Mouth Daily  At Bedtime For  Sleep 4)  Crestor 20 Mg Tabs (Rosuvastatin Calcium) .... One By Mouth Monday Nights and Thursday Night 5)  Ventolin Hfa 108 (90 Base) Mcg/act Aers (Albuterol Sulfate) .... Two Puff Twice A Day or Every 6 Hours As Needed  Allergies (verified): No Known Drug Allergies  Past History:  Past medical, surgical, family and social histories (including risk factors) reviewed for relevance to current acute and chronic problems.  Past Medical History: Reviewed history from 03/28/2007 and no changes required. Allergic rhinitis GERD hemmorhiods smoker Osteoarthritis left rotator cuff  Past Surgical History: Reviewed history from 02/27/2007 and no changes required. Appendectomy Rotator cuff repair  Family History: Reviewed history from 02/27/2007 and no changes required. Family History Other cancer-breast,cervical  Social History: Reviewed history from  03/28/2007 and no changes required. Occupation:contruction Single Current Smoker  Review of Systems      See HPI General:  Denies chills, fever, and malaise. Eyes:  Denies discharge, eye irritation, eye pain, and red eye. ENT:  Complains of nasal congestion, postnasal drainage, and sinus pressure; denies ear discharge, earache, and hoarseness. Resp:  Complains of cough and sputum productive; denies coughing up blood, shortness of breath, and wheezing. Derm:  Denies changes in color of skin, flushing, and rash.  Physical Exam  General:  Well-developed,well-nourished,in no acute distress; alert,appropriate and cooperative throughout examination Head:  Normocephalic and atraumatic without obvious abnormalities. No apparent alopecia or balding. +tenderness frontal sinus>maxillary sinuses Eyes:  pupils equal, pupils round, corneas and lenses clear, and no injection.   Ears:  External ear exam shows no significant lesions or deformities.  Otoscopic examination reveals clear canals, tympanic membranes are intact bilaterally without bulging, retraction, inflammation or discharge. Hearing is grossly normal bilaterally. Nose:  External nasal examination shows no deformity or inflammation. Nasal mucosa are pink and moist without lesions or exudates. Mouth:  Oral mucosa and oropharynx without lesions or exudates.  Teeth in good repair. Neck:  No deformities, masses, or tenderness noted. Lungs:  Normal respiratory effort, chest expands symmetrically. Lungs are clear to auscultation, no crackles or wheezes. Heart:  Normal rate and regular rhythm. S1 and S2 normal without gallop, murmur, click, rub or other extra sounds. Skin:  turgor normal, color normal, and no rashes.     Impression & Recommendations:  Problem # 1:  COUGH (ICD-786.2) Assessment Deteriorated Given chronicity of cough of cxr pa/lat. Retreat with abx  for possible sinusitis. Followup if no improvement or worsening.  Future  Orders: T-2 View CXR (71020TC) ... 07/22/2010  Complete Medication List: 1)  Multivitamins Caps (Multiple vitamin) .... Once daily 2)  Diazepam 10 Mg Tabs (Diazepam) .... One po q hs 3)  Mirtazapine 30 Mg Tabs (Mirtazapine) .... One by mouth daily  at bedtime for  sleep 4)  Crestor 20 Mg Tabs (Rosuvastatin calcium) .... One by mouth monday nights and thursday night 5)  Ventolin Hfa 108 (90 Base) Mcg/act Aers (Albuterol sulfate) .... Two puff twice a day or every 6 hours as needed 6)  Levaquin 500 Mg Tabs (Levofloxacin) .... One by mouth qd Prescriptions: LEVAQUIN 500 MG TABS (LEVOFLOXACIN) one by mouth qd  #14 x 0   Entered and Authorized by:   Edwyna Perfect MD   Signed by:   Edwyna Perfect MD on 07/21/2010   Method used:   Electronically to        [facility]. [phone]* (retail)       [address]       [place], [place]  [address]       Ph: [phone]       Fax: [phone]   RxID:   [id number]    Orders Added: 1)  T-2 View CXR [71020TC] 2)  Est. Patient Level IV [72536]

## 2010-08-18 NOTE — Progress Notes (Signed)
Summary: REQUEST FOR RX  Phone Note Call from Patient   Caller: Patient   [phone] Reason for Call: Acute Illness, Talk to Nurse Complaint: Breathing Problems Summary of Call: Pt called to speak with Dr Lovell Sheehan / Rushie Goltz, LPN.... Pt states that he thinks his abx has not worked, still exp sxs of chest congestion, sinus pressure, greenish-yellow drainage.... inquiring whether another abx can be sent to pharmacy:  [facility].... Pt was offered OV with another physician but adv he wants to wait and see if Dr Lovell Sheehan will send Rx (abx) into pharmacy.....?  Initial call taken by: Debbra Riding,  June 29, 2010 9:52 AM  Follow-up for Phone Call        per dr Lovell Sheehan- c omplete 10 days of medication as ordered-reassured Follow-up by: Willy Eddy, LPN,  June 29, 2010 10:49 AM

## 2010-09-14 ENCOUNTER — Ambulatory Visit (INDEPENDENT_AMBULATORY_CARE_PROVIDER_SITE_OTHER): Payer: BC Managed Care – PPO | Admitting: Psychology

## 2010-09-14 DIAGNOSIS — F331 Major depressive disorder, recurrent, moderate: Secondary | ICD-10-CM

## 2010-09-28 ENCOUNTER — Other Ambulatory Visit: Payer: Self-pay

## 2010-10-04 ENCOUNTER — Encounter: Payer: Self-pay | Admitting: Internal Medicine

## 2010-10-05 ENCOUNTER — Encounter: Payer: Self-pay | Admitting: Internal Medicine

## 2010-10-23 LAB — CBC
HCT: 42.4 % (ref 39.0–52.0)
Hemoglobin: 14.6 g/dL (ref 13.0–17.0)
MCHC: 34.5 g/dL (ref 30.0–36.0)
MCHC: 34.7 g/dL (ref 30.0–36.0)
MCV: 100.1 fL — ABNORMAL HIGH (ref 78.0–100.0)
RBC: 4.21 MIL/uL — ABNORMAL LOW (ref 4.22–5.81)
RBC: 4.61 MIL/uL (ref 4.22–5.81)
RDW: 12.6 % (ref 11.5–15.5)

## 2010-10-23 LAB — SAMPLE TO BLOOD BANK

## 2010-10-23 LAB — DIFFERENTIAL
Basophils Absolute: 0.1 10*3/uL (ref 0.0–0.1)
Basophils Relative: 0 % (ref 0–1)
Basophils Relative: 1 % (ref 0–1)
Eosinophils Absolute: 0.2 10*3/uL (ref 0.0–0.7)
Monocytes Absolute: 0.6 10*3/uL (ref 0.1–1.0)
Monocytes Absolute: 0.8 10*3/uL (ref 0.1–1.0)
Monocytes Relative: 5 % (ref 3–12)
Monocytes Relative: 8 % (ref 3–12)
Neutro Abs: 12.1 10*3/uL — ABNORMAL HIGH (ref 1.7–7.7)
Neutro Abs: 4.8 10*3/uL (ref 1.7–7.7)
Neutrophils Relative %: 76 % (ref 43–77)

## 2010-10-23 LAB — POCT I-STAT, CHEM 8
Calcium, Ion: 1.03 mmol/L — ABNORMAL LOW (ref 1.12–1.32)
Glucose, Bld: 97 mg/dL (ref 70–99)
HCT: 50 % (ref 39.0–52.0)
Hemoglobin: 17 g/dL (ref 13.0–17.0)
Potassium: 4.3 mEq/L (ref 3.5–5.1)

## 2010-10-23 LAB — CSF CELL COUNT WITH DIFFERENTIAL
RBC Count, CSF: 6100 /mm3 — ABNORMAL HIGH
RBC Count, CSF: 700 /mm3 — ABNORMAL HIGH
Tube #: 1
Tube #: 4
WBC, CSF: 1 /mm3 (ref 0–5)
WBC, CSF: 1 /mm3 (ref 0–5)

## 2010-10-23 LAB — CSF CULTURE W GRAM STAIN
Culture: NO GROWTH
Gram Stain: NONE SEEN

## 2010-10-23 LAB — PROTEIN, CSF: Total  Protein, CSF: 59 mg/dL — ABNORMAL HIGH (ref 15–45)

## 2010-10-23 LAB — GRAM STAIN

## 2010-11-18 ENCOUNTER — Telehealth: Payer: Self-pay | Admitting: Internal Medicine

## 2010-11-18 NOTE — Telephone Encounter (Signed)
Pt needs to get an ov to see Dr Lovell Sheehan re: chest congestion. Pls advise when we can work pt in sch?  Pt is needing to get another order for chest xray. See previous note.

## 2010-11-18 NOTE — Telephone Encounter (Signed)
Pt will need to see another md in facility--they can decided if he needs cxr

## 2010-11-18 NOTE — Telephone Encounter (Signed)
Left vm for pt to sch ov with another dr, as noted below. Waiting on cb.

## 2010-11-18 NOTE — Telephone Encounter (Signed)
Pt rcvd and order from Dr Rodena Medin to get a chest xray done in January 2012, for excessive chest congestion in chest. Pt never went to get xray and pt has lost order. Pt still has chest congestion.  Pt is wondering if Dr Rodena Medin can re-issue order or does pt need to sch another ov to see Dr Lovell Sheehan?

## 2010-11-28 NOTE — Telephone Encounter (Signed)
Pt has been sch to see Dr Rodena Medin re: getting ? cxr done. Pt coming in on 11/29/10, as noted.

## 2010-11-29 ENCOUNTER — Ambulatory Visit (INDEPENDENT_AMBULATORY_CARE_PROVIDER_SITE_OTHER)
Admission: RE | Admit: 2010-11-29 | Discharge: 2010-11-29 | Disposition: A | Discharge status: Home / Self Care | Payer: BC Managed Care – PPO | Source: Ambulatory Visit | Attending: Internal Medicine | Admitting: Internal Medicine

## 2010-11-29 ENCOUNTER — Ambulatory Visit (INDEPENDENT_AMBULATORY_CARE_PROVIDER_SITE_OTHER): Payer: BC Managed Care – PPO | Admitting: Internal Medicine

## 2010-11-29 ENCOUNTER — Encounter: Payer: Self-pay | Admitting: Internal Medicine

## 2010-11-29 DIAGNOSIS — R05 Cough: Secondary | ICD-10-CM

## 2010-11-29 DIAGNOSIS — F17200 Nicotine dependence, unspecified, uncomplicated: Secondary | ICD-10-CM

## 2010-11-29 DIAGNOSIS — R200 Anesthesia of skin: Secondary | ICD-10-CM

## 2010-11-29 DIAGNOSIS — R209 Unspecified disturbances of skin sensation: Secondary | ICD-10-CM

## 2010-11-29 DIAGNOSIS — R053 Chronic cough: Secondary | ICD-10-CM

## 2010-11-29 DIAGNOSIS — K921 Melena: Secondary | ICD-10-CM

## 2010-11-29 LAB — CBC WITH DIFFERENTIAL/PLATELET
Basophils Absolute: 0 10*3/uL (ref 0.0–0.1)
Eosinophils Absolute: 0.2 10*3/uL (ref 0.0–0.7)
HCT: 46.3 % (ref 39.0–52.0)
Hemoglobin: 16.4 g/dL (ref 13.0–17.0)
Lymphocytes Relative: 17 % (ref 12.0–46.0)
Lymphs Abs: 1.5 10*3/uL (ref 0.7–4.0)
MCHC: 35.5 g/dL (ref 30.0–36.0)
Neutro Abs: 6.4 10*3/uL (ref 1.4–7.7)
RDW: 12.8 % (ref 11.5–14.6)

## 2010-11-29 MED ORDER — FLUTICASONE PROPIONATE HFA 110 MCG/ACT IN AERO: 2.0000 | INHALATION_SPRAY | Freq: Two times a day (BID) | RESPIRATORY_TRACT | Status: DC

## 2010-11-29 NOTE — Assessment & Plan Note (Signed)
Obtain CBC. Given abdominal pain, change in bowel habits and blood in stool will proceed with GI consult.

## 2010-11-29 NOTE — Assessment & Plan Note (Signed)
?   Positional and neuropathic. Avoid nsaids currently given GI sx's. Followup with pmd

## 2010-11-29 NOTE — Discharge Summary (Signed)
NAME:  Joshua Manning, Joshua Manning              ACCOUNT NO.:  [id number]   MEDICAL RECORD NO.:  [id number]          PATIENT TYPE:  INP   LOCATION:  3027                         FACILITY:  [facility]   PHYSICIAN:  Pramod P. Pearlean Brownie, MD    DATE OF BIRTH:  February 27, 1966   DATE OF ADMISSION:  02/22/2009  DATE OF DISCHARGE:  02/23/2009                               DISCHARGE SUMMARY   DIAGNOSES AT TIME OF DISCHARGE:  1. Benign postcoital headache.  2. Migraines.  3. Rotator cuff repair.  4. Appendectomy.  5. Cigarette smoker.   MEDICINES AT TIME OF DISCHARGE:  1. Nicotine patch 21 mg a day x6 weeks, 14 mg a day x2 weeks, then 7      mg a day x2 weeks, then stop.  2. Topamax 25 mg one a day x7 days, then increase to b.i.d.  3. Ultram 50 mg one to two tablets every 6 hours p.r.n. headache.  4. Fish oil 1000 mg two tablets twice a day.  5. Multivitamin one a day.   STUDIES PERFORMED:  1. CT of the brain on admission shows no acute abnormality, orbits for      foreign body two-view shows no metallic foreign bodies within the      orbits.  2. MRI of the brain is normal.  3. 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  4. Cerebral angiogram shows a 2-mm conical-shaped outpouch in the      right posterior communicating artery, most likely representing an      infundibulum, possible mild atherosclerotic changes in the left A1      segment.  5. EKG not present in chart, though telemetry strip shows normal sinus      rhythm.   LABORATORY STUDIES:  1. CBC with red blood cells 4.21, MCV 100.7, otherwise, normal.  CSF      cultures show no growth.  CSF cell count shows straw-colored CSF      clear with 700 red blood cells and white blood cells 1.  2. Tube showed red CSF, but was cloudy with 1 white blood cell and      6100 red blood cells.  Gram stain, no organisms seen.  Protein high      at 59, glucose 55.  Coags  normal.  Chemistry with calcium 1.03,      sodium 126, BUN less than 3, creatinine is 0.8.   HISTORY OF PRESENT ILLNESS:  Joshua Manning is a 45 year old right-  handed Caucasian male with history of chronic tobacco abuse and  migraines for last prominent 7 years ago.  The patient had evidence of a  postcoital headache that occurred tonight at point of orgasm.  The  headache started in the occiput and migrated to the frontal region of  his head.  He felt as if head was being squeezed.  Initially, there was  throbbing pain, but the headaches have basically become rebating,  steady, and holocephalic.  The patient has not had significant  nausea.  He does have some photophobia.  He says the pain is different from his  migraines, which he is to have, which were associated with throbbing  with nausea and sensory to light and tender scalp.  The patient also has  decreased range of motion of his neck.   Spinal tap was carried out in the early morning hours of February 22, 2009,  that initially showed 6100 blood cells in the first tube and dropped to  700 in tube forth.  The fluid is clear.  Given the period of time  between onset of symptoms and lumbar puncture being 48 hours, one would  have expected xanthochromia and thus it appeared that this was a  traumatic tap.  CT of the brain was unremarkable.  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  Neurology was consulted for the headache  etiology.   At the time of neurology exam, the patient complained of numbness of the  left side of his face and right-sided weakness.  The patient states he  did not initially go to the emergency room and spent the rest of the day  standing.  Any time he laid down, the pain worsened.  He later called  his sister, who is a paramedic and she recommended to go to the  emergency room.  She states his speech was slurred.  He presented to   [facility] and complained of slight weakness and sweatiness of both  arms.  Initial exam felt his effort was inconsistent.  He was admitted  to the hospital for further evaluation of possible aneurysm.  No signs  of subarachnoid hemorrhage were obvious.   HOSPITAL COURSE:  Cerebral angiogram was done on the following day by  Dr. Julieanne Cotton which was negative for aneurysm.  He did have a 2-  mm infundibulum.  He felt his headache was likely benign postcoital  headache and not a subarachnoid hemorrhage.  He was placed on Topamax  for secondary headache prevention.  It was advised to stop smoking and  started on the nicotine patch.  He will be discharged home and asked not  to have sex for 2 weeks or to drive for 1.  He can follow up with Dr.  Pearlean Brownie in the office in 2 months.   CONDITION ON DISCHARGE:  The patient is alert and oriented x3.  Speech  clear.  No aphasia.  No weakness.  Headache improved, but not resolved.   DISCHARGE PLAN:  1. Discharge home with family.  2. New Topamax for headache prevention.  3. Ultram for breakthrough headache pain.   FOLLOWUP:  1. Dr. Pearlean Brownie as needed in 2 months.  2. Stop smoking.  Nicotine patch prescription given.      Annie Main, N.P.    ______________________________  Sunny Schlein. Pearlean Brownie, MD    SB/MEDQ  D:  02/23/2009  T:  02/24/2009  Job:  [id number]   cc:   Stacie Glaze, MD

## 2010-11-29 NOTE — Assessment & Plan Note (Signed)
Counseled regarding the need for cessation. Pt states understanding and agreement.

## 2010-11-29 NOTE — Progress Notes (Signed)
  Subjective:    Patient ID: Joshua Manning, male    DOB: 12-30-1965, 45 y.o.   MRN: [id number]  HPI Pt presents to clinic for evaluation of chronic cough and blood in stool. Seen Jan 2012 with sinusitis and cough s/p augmentin. Placed on 14 day course of levaquin at the time and resolved sinus sx's but cough persisted. [facility] for cxr. Cough is now primarily non productive without hemoptysis. Continues to smoke tobacco ~2ppd (partial use =~1ppd) and is attempting cessation. Has attempted chantix and wellbutrin in the past.  Notes intermittent wheezing worse at night and mild DOE not improved by albuterol mdi prn.   Notes recent intermittent abdominal discomfort followed by fecal urgency with blood in stool. Has h/o hemorrhoid and relates h/o colonoscopy ?~15 years ago with finding of bengin polyps per pt. No wt loss or sweats.  C/o intermittent nocturnal arm paresthesias that can affect either arm and appear to be positional. Occur at the level of the elbow and lower. No current neck pain or true radicular sx's.  Reviewed pmh, medications and allergies.    Review of Systems  Constitutional: Negative for fever and chills.  Respiratory: Positive for cough, shortness of breath and wheezing.   Gastrointestinal: Positive for abdominal pain, blood in stool and abdominal distention.  Neurological: Positive for numbness. Negative for weakness.       Objective:   Physical Exam  Nursing note and vitals reviewed. Constitutional: He appears well-developed and well-nourished. No distress.  HENT:  Head: Normocephalic and atraumatic.  Right Ear: External ear normal.  Left Ear: External ear normal.  Nose: Nose normal.  Eyes: Conjunctivae are normal. No scleral icterus.  Cardiovascular: Normal rate, regular rhythm and normal heart sounds.  Exam reveals no gallop and no friction rub.   No murmur heard. Pulmonary/Chest: Effort normal and breath sounds normal. No respiratory distress. He has no wheezes.  He has no rales.  Abdominal: Soft. Bowel sounds are normal. He exhibits distension. He exhibits no mass. There is tenderness in the epigastric area. There is guarding. There is no rigidity and no rebound.  Musculoskeletal: Normal range of motion.  Neurological: He is alert.  Skin: Skin is warm and dry. He is not diaphoretic. No erythema.  Psychiatric: He has a normal mood and affect.          Assessment & Plan:

## 2010-11-29 NOTE — Assessment & Plan Note (Signed)
Obtain CXR pa/lat. S/p 2 courses of abx without improvement. Begin flovent and instructed to rinse mouth after use. Smoking cessation recommended. Scheduled f/u with pmd (missed last appt)

## 2010-11-29 NOTE — Consult Note (Signed)
NAMEAVRAM, Joshua Manning              ACCOUNT NO.:  [id number]   MEDICAL RECORD NO.:  [id number]          PATIENT TYPE:  INP   LOCATION:  3027                         FACILITY:  [facility]   PHYSICIAN:  Deanna Artis. Hickling, M.D.DATE OF BIRTH:  10-15-1965   DATE OF CONSULTATION:  02/22/2009  DATE OF DISCHARGE:                                 CONSULTATION   CHIEF COMPLAINT:  Headache.   HISTORY OF PRESENT CONDITION:  The patient is a 45 year old gentleman  with history of chronic tobacco abuse and migraines.  Headaches were  last prominent about 7 years ago (my PA obtained a history that it was  only 2 or 3 years ago).  The patient had evidence of a postcoital  headache.  This started in the occiput and migrated to the frontal  region of his head.  He felt as if his head was being squeezed.  Initially, there was throbbing pain, but the headache has basically  become unremitting steady holocephalic.  The patient has not had  significant nausea.  He does have some photophobia.   He says that the pain is different from his migraines which used to be  throbbing and associated with nausea sensory to light and tender scalp.   The patient has decreased range of motion of his neck.   He had spinal tap carried out in the early morning hours of August 9.  This showed initially 6100 red blood cells in tube 1 with 1 white blood  cell and dropped to 700 in tube 4.  The supernatant is clear.  Given the  period of time between the onset of symptoms and lumbar puncture was  almost 48 hours, one would have expected xanthochromia and thus it  appeared that this was a traumatic tap.   The patient had a CT scan of the brain which was negative.  He had an  MRI scan of the brain which showed what appeared to be either a wide-  necked aneurysm or infundibulum in the right anterior choroidal artery.  As a result of this, I was asked to see the patient to determine the  etiology of his headaches, the CSF blood  and the abnormal MRA.  MRI scan  was entirely normal.   The patient at that time of my evaluation complained of numbness on the  left side of his face.  He complained of right-sided weakness when he  first was seen in the [facility].   The patient states that he did not initially go to the emergency room.  He spent the rest of day standing.  Any time he laid down the pain  worsened.  He called his sister who is a paramedic.  She recommended  that he go the emergency room.  She said that his speech was slurred.  He had gone to the [employer].  He complained of slight  weakness and slowness of both arms.  During the examination of my PA, he  felt that the effort was inconsistent.   PAST MEDICAL HISTORY:  Unremarkable for other medical problems.   PAST  SURGICAL HISTORY:  The patient had repair of the rotator cuff in  his shoulder and had also an appendectomy.   CURRENT MEDICATIONS:  None.   DRUG ALLERGIES:  None.   FAMILY HISTORY:  Negative for stroke, hemorrhage, or other nervous  system problems.   SOCIAL HISTORY:  The patient smokes a pack and half to two packs per day  and has done so for couple years.  He said that he has escalated the  amount of tobacco smoking over the past couple years.  He drinks 24  beers per week.  He does not use illicit drugs.   REVIEW OF SYSTEMS:  Unremarkable for any symptoms except that was  described in the 12-system review.   PHYSICAL EXAMINATION:  VITAL SIGNS:  On examination today, blood  pressure 168/103, resting pulse 80, respirations 20, temperature 97.5.  GENERAL:  The patient has decreased range of motion of his neck.  He has  trouble flexing, extending, bringing his ear down to his shoulder and  twisting his chin to the shoulder.  HEENT:  There were no cranial or cervical bruits.  He had some  tenderness in the occipital region of his head.  He has no signs of  infection in tympanic membranes,  oropharynx, or nasopharynx.  LUNGS: Clear to auscultation.  HEART:  No murmurs.  Pulses normal.  ABDOMEN:  Soft, nontender.  Bowel sounds normal.  EXTREMITIES:  Well formed without edema or cyanosis.  NEUROLOGIC:  Awake, alert, attentive, appropriate.  He was oriented.  He  spoke somewhat slowly but distinctly.  He has no dysphasia.  Cranial  Nerves: Round, reactive pupils.  Visual fields full.  Extraocular  movements full and conjugate.  Symmetric facial strength.  Midline  tongue.  Air conduction greater than bone conduction bilaterally.  He  splits the midline with hypesthesia with the left side being normal and  the right being hypesthetic.  He also splits the midline with tuning  fork in his skull.  He seems to have normal sensation on his trunk.  He  splits with a midline on the neck and also has hypesthesia in his right  arm and right leg.  He seemed to have some hypesthesia to cold, as well  as pinprick.  He had good proprioception, vibration, and stereoagnosis  bilaterally.  Deep tendon reflexes were symmetrically diminished but  normal.  The patient had bilateral flexor plantar responses.  His gait  was a bit unsteady.  This was worse when he narrowed his gait.  He was  able to get up on his toes and his heels.  Romberg response was shaky.  Deep tendon reflexes were symmetric and normal.  The patient had  bilateral flexor plantar responses.   IMPRESSION:  1. Intractable postcoital headache.  2. I see no signs of subarachnoid hemorrhage but he does have      decreased range of motion of his neck.  He is thinking slowly and      he is mildly unsteady.  He has received at least several doses of      Dilaudid, however.   We are going to admit him to observe him, but also to perform an  catheter-based angiogram to evaluate the infundibulum/small aneurysm in  the left distal internal carotid artery.  I tried to have it done today,  however, it was not possible to schedule it.    The patient was placed on the stroke service and if workup is negative,  will likely  be discharged home.  We will give him Dilaudid for pain  control at present..   His laboratory studies; lumbar puncture showed a glucose of 53, protein  of 59.  Sodium low at 126, potassium 4.3, chloride 97, CO2 21, BUN less  than 3, creatinine 0.8, white blood cell count 15,800, hemoglobin 16.0,  hematocrit  46.2, platelet count 225,000.  PT 12.5, INR 0.9, PTT 28.  I need correct  the record that the lesion is 3 x 2 x 2 mm in the right anterior carotid  artery not the left.  Catheterized angiogram will sort thiis out  whether this was an infundibulum or an aneurysm.  We will provide pain  medication for the patient and we will observe him.      Deanna Artis. Sharene Skeans, M.D.  Electronically Signed     [facility]/MEDQ  D:  02/22/2009  T:  02/23/2009  Job:  [id number]   cc:   Stacie Glaze, MD

## 2010-12-02 ENCOUNTER — Telehealth: Payer: Self-pay

## 2010-12-02 NOTE — Telephone Encounter (Signed)
Left detailed message to notify pt.

## 2010-12-02 NOTE — Op Note (Signed)
[place]  Patient:    Joshua Manning, Joshua Manning                     MRN: [id number] Proc. Date: 08/20/00 Adm. Date:  [id number] Disc. Date: [id number] Attending:  Retia Passe                           Operative Report  PREOPERATIVE DIAGNOSES:  A 4.5 cm intrabony cyst, left posterior mandible, deeply impacted third molar tooth #17, involved tooth #18, impacted third molar tooth #2.  PROCEDURES:  Removal intrabony cyst and the above teeth.  SURGEON:  Vania Rea. Warren Danes, D.D.S.  FIRST ASSISTANT:  [place].  ANESTHESIA:  General via nasoendotracheal intubation.  ESTIMATED BLOOD LOSS:  Less than 50 cc.  FLUID REPLACEMENT:  Approximately 1000 cc crystalloid solution.  COMPLICATIONS:  None apparent.  INDICATIONS FOR PROCEDURE:  Joshua Manning is a 45 year old gentleman who was referred to my office for removal of the cyst from the left mandible.  The patient presented with a deeply impacted third molar tooth #17 and a large cystic lesion had formed around this tooth, eroding the root of tooth #18. The patient also presented with impacted third molar tooth #32 to be removed at the same time of the cyst removal.  Due to the size of the cyst and the proximity to the neurovascular bundle, the procedure was scheduled under general anesthesia in a day surgical setting.  DESCRIPTION OF PROCEDURE:  On August 18, 2000, Joshua Manning was taken to [facility], where he was placed on the operating room table in the supine position.  Following successful intubation and general anesthesia, the patients face, neck, and oral cavity were prepped and draped in the usual sterile operating room fashion.  The hypopharynx was suctioned free of fluids and secretions, and a moistened two-inch vaginal pack was placed as a throat pack.  Attention was then directed intraorally, where a approximately 10 cc of 0.5% Xylocaine containing 1:200,000 epinephrine were  infiltrated in the right and left inferior alveolar neurovascular region and the posterior palatal buccal soft tissues.  Attention was then directed toward the left posterior mandible, where a 2.5 curvilinear incision was made through the mucoperiosteal tissues over tooth #17 and around the buccal neck of tooth #18.  A full-thickness mucoperiosteal flap was elevated laterally and inferiorly for the distance of approximately 2.0 cm, exposing the lateral cortical bone of the mandible.  The overlying cortical bone above tooth #17 was reduced using a Stryker rotary osteotome and a #8 round bur with copious irrigation.  A large cystic cavity was entered with yellowish-brown, cheesy material exuding from the area.  The intraosseous window was enlarged, exposing the tooth #17.  The tooth was then sectioned on its long and vertical axes using a 107 Fisher bur and the Stryker rotary osteotome.  The tooth was subluxated from the alveolus using an 11A elevator, and the segments of teeth were removed using a mosquito hemostat.  A large cystic space was entered with a thick lining.  This was curetted from the alveolus using a double-ended Molt curette and the tissue removed and submitted for histologic examination.  The cystic space was thoroughly investigated, and the roots of tooth #18 were found to have resorbed several millimeters.  The decision was made to remove this tooth, since the tooth had eroded through the roots and into the furcation space. Tooth #18 was subluxated  using an 11A elevator and removed from the oral cavity using a #23 dental forceps.  The bony margins were then smoothed with a Stryker rotary osteotome and a #8 round bur.  The area was thoroughly irrigated and suctioned.  The cystic lining space was inspected and found to be free of remnants of the cyst.  The mucoperiosteal margin was then approximated and sutured in an interrupted fashion using 4-0 chromic suture material on an  FS2 needle.  Tooth #32 was then removed in a similar fashion.  The oral cavity was thoroughly suctioned of fluid and secretions.  The throat pack was removed and the hypopharynx suctioned free of fluids and secretions.  Joshua Manning was allowed to awaken from the anesthesia and taken to the recovery room, where he appeared to have tolerated the procedure well without apparent complications. DD:  08/28/00 TD:  08/28/00 Job: [id number]

## 2010-12-02 NOTE — Telephone Encounter (Signed)
Message copied by Kyung Rudd on Fri Dec 02, 2010  4:01 PM ------      Message from: Letitia Libra, Maisie Fus      Created: Thu Dec 01, 2010  4:01 PM       Cxr= no obvious mass or pneumonia

## 2010-12-02 NOTE — Telephone Encounter (Signed)
Message copied by Kyung Rudd on Fri Dec 02, 2010  4:02 PM ------      Message from: Letitia Libra, Maisie Fus      Created: Thu Dec 01, 2010  4:57 PM       Cbc nl

## 2010-12-02 NOTE — Op Note (Signed)
Joshua Manning, Hoard NO.:  [id number]   MEDICAL RECORD NO.:  [id number]                   PATIENT TYPE:   LOCATION:                                       FACILITY:   PHYSICIAN:  Dionne Ano. Everlene Other, M.D.         DATE OF BIRTH:  Jun 10, 1966   DATE OF PROCEDURE:  03/23/2002  DATE OF DISCHARGE:                                 OPERATIVE REPORT   HISTORY OF PRESENT ILLNESS/INDICATIONS:  The patient was seen in the [facility] Emergency Room upon the kind referral from the emergency staff.  This  patient is a 45 year old man who injured his right hand dominant right  middle saw today at 6 p.m.  He was given tetanus and Ancef and stabilized.  He was referred for my care.  He denies other injury.   PAST MEDICAL HISTORY:  None.   PAST SURGICAL HISTORY:  1. Shoulder shoulder.  2. Appendectomy.   ALLERGIES:  None.   MEDICINES:  None.   SOCIAL HISTORY:  He smokes two packs per day.  He owns a [employer].   PHYSICAL EXAMINATION:  GENERAL:  Very pleasant male, alert and oriented, in  no acute distress.  EXTREMITIES:  The patient has a very jagged lacerated distal tip of the  middle finger.  His PIP and MCP joints are nontender.  Elbow and shoulder  are quiescent.  Left upper extremity is neurovascularly intact.  The  patient's middle finger has significant deformity gauging injury from the  table saw and a distal phalanx fracture which is opened.  HEENT:  Examination is benign.  CHEST:  Clear.   LABORATORY DATA:  X-rays confirm the fracture of the distal phalanx.   IMPRESSION:  Near amputation of the right middle finger with open fracture,  nail bed laceration, etc.   PLAN:  I verbally consented for I&D and repair of structures as necessary.   SURGEON:  Dionne Ano. Amanda Pea, M.D.   DESCRIPTION OF PROCEDURE:  The patient was taken to the procedure area.  Digital block was previously placed the emergency room staff and was in  excellent  working condition.  He then underwent a 10 minute surgical  Betadine scrub followed by copious irrigation and sterile draping.  Following this, I performed excision and debridement of skin, subcutaneous  tissue and bone as necessary.  This was an excisional debridement of an open  fracture.  Following this, I performed nail plate removal without difficulty  atraumatically.  Once this was done, the area was copiously lavaged once  again.  Following this, the patient underwent reapproximation of the distal  bony fragments followed by nail bed repair with 6-0 Vicryl and a 4.0  magnification encountering this well without difficulty.  Once this was  done, the patient then underwent closure of the jagged wounds with  interrupted 5-0 and 4-0 chromic suture in combination.  The patient  tolerated this well without difficulty.  The tourniquet was  deflated.  Refill was adequate.  The distal tip tissues were very compromised and  traumatized from the saw injury.  We will allow them to declare themselves.  I dressed the wound sterilely without difficulty.  There were no  complications.   DISCHARGE INSTRUCTIONS:  1. He will be discharged home on Keflex as well as Mepergan Fortis to be     taken as directed.  2. He will see [place] in the office in six days for a wound check and therapy     and see myself in 14 days.  3. I have discussed elevation, do's and dont's.  4. Recommend smoking cessation and other measures.  5. He will notify me if there are any problems.  Otherwise look forward to     seeing him in the office as mentioned above.                                                  Dionne Ano. Everlene Other, M.D.    Nash Mantis  D:  03/23/2002  T:  03/24/2002  Job:  [id number]

## 2010-12-14 NOTE — Progress Notes (Signed)
This encounter was created in error - please disregard.

## 2010-12-20 ENCOUNTER — Ambulatory Visit (INDEPENDENT_AMBULATORY_CARE_PROVIDER_SITE_OTHER): Payer: BC Managed Care – PPO | Admitting: Internal Medicine

## 2010-12-20 ENCOUNTER — Encounter: Payer: Self-pay | Admitting: Internal Medicine

## 2010-12-20 VITALS — BP 124/80 | HR 76 | Temp 98.2°F | Resp 16 | Ht 70.0 in | Wt 184.0 lb

## 2010-12-20 DIAGNOSIS — J209 Acute bronchitis, unspecified: Secondary | ICD-10-CM

## 2010-12-20 DIAGNOSIS — K648 Other hemorrhoids: Secondary | ICD-10-CM

## 2010-12-20 DIAGNOSIS — J449 Chronic obstructive pulmonary disease, unspecified: Secondary | ICD-10-CM

## 2010-12-20 DIAGNOSIS — Z7289 Other problems related to lifestyle: Secondary | ICD-10-CM | POA: Insufficient documentation

## 2010-12-20 DIAGNOSIS — K219 Gastro-esophageal reflux disease without esophagitis: Secondary | ICD-10-CM

## 2010-12-20 DIAGNOSIS — F17200 Nicotine dependence, unspecified, uncomplicated: Secondary | ICD-10-CM

## 2010-12-20 MED ORDER — HYDROCORTISONE ACE-PRAMOXINE 2.5-1 % RE CREA: TOPICAL_CREAM | Freq: Three times a day (TID) | RECTAL | Status: AC

## 2010-12-20 MED ORDER — METHYLPREDNISOLONE (PAK) 4 MG PO TABS: 4.0000 mg | ORAL_TABLET | Freq: Every day | ORAL | Status: DC

## 2010-12-20 MED ORDER — FLUTICASONE-SALMETEROL 100-50 MCG/DOSE IN AEPB: 1.0000 | INHALATION_SPRAY | Freq: Two times a day (BID) | RESPIRATORY_TRACT | Status: DC

## 2010-12-20 MED ORDER — DOXYCYCLINE HYCLATE 100 MG PO TABS: 100.0000 mg | ORAL_TABLET | Freq: Two times a day (BID) | ORAL | Status: AC

## 2010-12-20 NOTE — Assessment & Plan Note (Signed)
Patient's been treated 2 times by Dr. 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

## 2010-12-20 NOTE — Progress Notes (Signed)
  Subjective:    Patient ID: Joshua Manning, male    DOB: Apr 01, 1966, 45 y.o.   MRN: [id number]  HPI  Patient is a 45 year old white male long-standing history of tobacco abuse and chronic bronchitis.  Then seen 3 times in the past 6 months for bronchitis confirming a diagnosis of chronic asthmatic bronchitis.  He continues to smoke 1-1/2 packs per day and with the psychologist and the patient agreed that his use of alcohol is excessive.  He presents today with chronic cough shortness of breath despite recent treatment for bronchitis.  He states that he was supposed to have been given a prescription for a maintenance drug at his last office visit but it did not reach his pharmacy.  Takes albuterol when necessary basis during these asthmatic flares  Review of Systems  Constitutional: Negative for fever and fatigue.  HENT: Negative for hearing loss, congestion, neck pain and postnasal drip.   Eyes: Negative for discharge, redness and visual disturbance.  Respiratory: Positive for chest tightness and wheezing. Negative for cough and shortness of breath.   Cardiovascular: Negative for leg swelling.  Gastrointestinal: Negative for abdominal pain, constipation and abdominal distention.  Genitourinary: Negative for urgency and frequency.  Musculoskeletal: Negative for joint swelling and arthralgias.  Skin: Negative for color change and rash.  Neurological: Negative for weakness and light-headedness.  Hematological: Negative for adenopathy.  Psychiatric/Behavioral: Negative for behavioral problems.   Past Medical History  Diagnosis Date  . Allergy   . GERD (gastroesophageal reflux disease)   . Hemorrhoids   . Arthritis    Past Surgical History  Procedure Date  . Appendectomy   . Rotator cuff repair     reports that he has been smoking.  He does not have any smokeless tobacco history on file. He reports that he drinks alcohol. He reports that he does not use illicit drugs. family  history includes Cancer in his mother and Cervical cancer in an unspecified family member. No Known Allergies     Objective:   Physical Exam  Constitutional: He appears well-developed and well-nourished.  HENT:  Head: Normocephalic and atraumatic.  Eyes: Conjunctivae are normal. Pupils are equal, round, and reactive to light.  Neck: Normal range of motion. Neck supple.  Cardiovascular: Normal rate and regular rhythm.   Pulmonary/Chest: Effort normal and breath sounds normal.  Abdominal: Soft. Bowel sounds are normal.          Assessment & Plan:   chronic asthmatic bronchitis patient is placed on Advair 100/50 one puff by mouth twice a day he'll be given a Medrol Dosepak for an acute flare as well as doxycycline twice a day for 14 days he is instructed that his tobacco use is the underlying etiology of his worsening pulmonary condition he recognizes this and states that he has a plan to quit smoking in the near term future.  His hemorrhoids have flared recently and he has lost his prescription for the Analpram this is unsuccessful in controlling them in the past.  He also has erectile dysfunction and was given samples of Levitra.  He has a colonoscopy planned possible definitive treatment of his hemorrhoids by banding If a colonoscopy is planned for surgical treatment of hemorrhoids or planned due to his history of alcohol overuse I would obtain a protime

## 2011-01-01 ENCOUNTER — Emergency Department (HOSPITAL_COMMUNITY)
Admission: EM | Admit: 2011-01-01 | Discharge: 2011-01-01 | Disposition: A | Discharge status: Home / Self Care | Payer: BC Managed Care – PPO | Attending: Emergency Medicine | Admitting: Emergency Medicine

## 2011-01-01 ENCOUNTER — Ambulatory Visit (INDEPENDENT_AMBULATORY_CARE_PROVIDER_SITE_OTHER): Payer: BC Managed Care – PPO

## 2011-01-01 ENCOUNTER — Inpatient Hospital Stay (INDEPENDENT_AMBULATORY_CARE_PROVIDER_SITE_OTHER)
Admission: RE | Admit: 2011-01-01 | Discharge: 2011-01-01 | Disposition: A | Discharge status: Home / Self Care | Payer: BC Managed Care – PPO | Source: Ambulatory Visit | Attending: Family Medicine | Admitting: Family Medicine

## 2011-01-01 DIAGNOSIS — R0989 Other specified symptoms and signs involving the circulatory and respiratory systems: Secondary | ICD-10-CM

## 2011-01-01 DIAGNOSIS — R0609 Other forms of dyspnea: Secondary | ICD-10-CM

## 2011-01-01 DIAGNOSIS — Z0389 Encounter for observation for other suspected diseases and conditions ruled out: Secondary | ICD-10-CM | POA: Insufficient documentation

## 2011-01-02 ENCOUNTER — Telehealth: Payer: Self-pay | Admitting: Internal Medicine

## 2011-01-02 NOTE — Telephone Encounter (Signed)
No one available to take call.

## 2011-01-02 NOTE — Telephone Encounter (Signed)
Make sure that he is rinsing his mouth out after using the Advair Diskus that he was given in the emergency room. The emergency room thought that this might be an asthmatic bronchitis attack related to smoking and possibly precipitated by an infection   He continues to have swelling in the mouth he may need a Medrol Dosepak 4 mg

## 2011-01-02 NOTE — Telephone Encounter (Signed)
Pt called and said that he may be having side effects from some of his meds. Pt is been having sob, pt can't yawn fully, bloated feeling, feels full after starting a meal. Pls adivse.

## 2011-01-04 NOTE — Telephone Encounter (Signed)
Left message on machine for patient  To return our call 

## 2011-01-05 ENCOUNTER — Encounter: Payer: Self-pay | Admitting: Internal Medicine

## 2011-01-05 ENCOUNTER — Ambulatory Visit (INDEPENDENT_AMBULATORY_CARE_PROVIDER_SITE_OTHER): Payer: BC Managed Care – PPO | Admitting: Internal Medicine

## 2011-01-05 DIAGNOSIS — J310 Chronic rhinitis: Secondary | ICD-10-CM

## 2011-01-05 DIAGNOSIS — R05 Cough: Secondary | ICD-10-CM

## 2011-01-05 DIAGNOSIS — K921 Melena: Secondary | ICD-10-CM

## 2011-01-05 DIAGNOSIS — F17200 Nicotine dependence, unspecified, uncomplicated: Secondary | ICD-10-CM

## 2011-01-05 DIAGNOSIS — R200 Anesthesia of skin: Secondary | ICD-10-CM

## 2011-01-05 DIAGNOSIS — J449 Chronic obstructive pulmonary disease, unspecified: Secondary | ICD-10-CM

## 2011-01-05 DIAGNOSIS — R209 Unspecified disturbances of skin sensation: Secondary | ICD-10-CM

## 2011-01-05 LAB — BASIC METABOLIC PANEL
BUN: 6 mg/dL (ref 6–23)
Chloride: 97 mEq/L (ref 96–112)
Creatinine, Ser: 0.7 mg/dL (ref 0.4–1.5)
GFR: 129.13 mL/min (ref >60.00)
Glucose, Bld: 82 mg/dL (ref 70–99)
Potassium: 4.5 mEq/L (ref 3.5–5.1)

## 2011-01-05 MED ORDER — MOMETASONE FUROATE 50 MCG/ACT NA SUSP: 2.0000 | Freq: Every day | NASAL | Status: DC

## 2011-01-05 MED ORDER — BROMPHENIRAMINE-PSEUDOEPH 12-120 MG PO CPCR: 1.0000 | ORAL_CAPSULE | Freq: Two times a day (BID) | ORAL | Status: DC

## 2011-01-06 ENCOUNTER — Ambulatory Visit (INDEPENDENT_AMBULATORY_CARE_PROVIDER_SITE_OTHER)
Admission: RE | Admit: 2011-01-06 | Discharge: 2011-01-06 | Disposition: A | Discharge status: Home / Self Care | Payer: BC Managed Care – PPO | Source: Ambulatory Visit | Attending: Internal Medicine | Admitting: Internal Medicine

## 2011-01-06 DIAGNOSIS — J449 Chronic obstructive pulmonary disease, unspecified: Secondary | ICD-10-CM

## 2011-01-06 DIAGNOSIS — R05 Cough: Secondary | ICD-10-CM

## 2011-01-06 DIAGNOSIS — F17200 Nicotine dependence, unspecified, uncomplicated: Secondary | ICD-10-CM

## 2011-01-06 MED ORDER — IOHEXOL 300 MG/ML  SOLN
80.0000 mL | Freq: Once | INTRAMUSCULAR | Status: AC | PRN
  Administered 2011-01-06: 80 mL via INTRAVENOUS

## 2011-01-09 ENCOUNTER — Encounter: Payer: Self-pay | Admitting: Gastroenterology

## 2011-01-09 ENCOUNTER — Telehealth: Payer: Self-pay | Admitting: Internal Medicine

## 2011-01-09 ENCOUNTER — Ambulatory Visit (INDEPENDENT_AMBULATORY_CARE_PROVIDER_SITE_OTHER): Payer: BC Managed Care – PPO | Admitting: Gastroenterology

## 2011-01-09 ENCOUNTER — Other Ambulatory Visit (INDEPENDENT_AMBULATORY_CARE_PROVIDER_SITE_OTHER): Payer: BC Managed Care – PPO

## 2011-01-09 DIAGNOSIS — K921 Melena: Secondary | ICD-10-CM

## 2011-01-09 DIAGNOSIS — R1013 Epigastric pain: Secondary | ICD-10-CM

## 2011-01-09 DIAGNOSIS — R198 Other specified symptoms and signs involving the digestive system and abdomen: Secondary | ICD-10-CM

## 2011-01-09 LAB — HEPATIC FUNCTION PANEL
ALT: 39 U/L (ref 0–53)
AST: 26 U/L (ref 0–37)
Alkaline Phosphatase: 61 U/L (ref 39–117)
Bilirubin, Direct: 0.1 mg/dL (ref 0.0–0.3)
Total Protein: 7.7 g/dL (ref 6.0–8.3)

## 2011-01-09 MED ORDER — ALIGN PO CAPS: 1.0000 | ORAL_CAPSULE | Freq: Every day | ORAL | Status: DC

## 2011-01-09 MED ORDER — PEG-KCL-NACL-NASULF-NA ASC-C 100 G PO SOLR: 1.0000 | Freq: Once | ORAL | Status: DC

## 2011-01-09 NOTE — Telephone Encounter (Signed)
Pt went to [facility] and [address] to pick up script and was told by pharmacist that meds were not rcvd from Dr. Martha Clan call in asap today.

## 2011-01-09 NOTE — Telephone Encounter (Signed)
Called in by phone and given to pharmacist- pt informed

## 2011-01-09 NOTE — Patient Instructions (Addendum)
Go directly to the basement to have your labs drawn today. You have been scheduled for a Upper Endoscopy/ Colonoscopy. Separate instructions given. Pick up your prep from your pharmacy.  Start Align samples one tablet by mouth once daily x 1 month.  cc: Darryll Capers, MD

## 2011-01-09 NOTE — Progress Notes (Signed)
History of Present Illness: This is a 45 year old male relates a one year history of a slight change in bowel habits. He generally notes one loose stool per day instead of one well-formed bowel movement per day. He has intermittent bright red blood per rectum that he attributes to hemorrhoids. He notes intermittent epigastric pain and early satiety with abdominal bloating but no increased intestinal gas. He has recently had some belching and a 10 pound weight gain. CBC unremarkable. He has recently had hyponatremia that has improved. Etiology not clear to me in reading the notes. He denies dysphagia, odynophagia, nausea, vomiting, change in stool caliber, melena. He recently underwent a chest CT scan with results listed below. He has had a persistent cough and shortness of breath. No findings in the upper abdomen on the chest CT scan except mild fatty liver.  IMPRESSION:  1. Primarily lower lobe (minimal inferior right middle lobe and  lingula) slight mosaic attenuation ground-glass opacities.  Differential diagnosis favors respiratory bronchiolitis (such as:  post infectious, medication, toxin, collagen vascular disease) and  hypersensitivity pneumonitis. Early manifestation of UIP, NSP, DIP  possible, yet felt less likely, with no other interstitial lung  abnormality visualized.  2. Bilateral gynecomastia with borderline fatty liver with upper abdominal organs otherwise normal-  appearing. 3. Otherwise, negative.  Review of Systems: Pertinent positive and negative review of systems were noted in the above HPI section. All other review of systems were otherwise negative.  Current Medications, Allergies, Past Medical History, Past Surgical History, Family History and Social History were reviewed in [facility] record.  Physical Exam: General: Well developed , well nourished, no acute distress Head: Normocephalic and atraumatic Eyes:  sclerae anicteric, EOMI Ears: Normal  auditory acuity Mouth: No deformity or lesions Neck: Supple, no masses or thyromegaly Lungs: Clear throughout to auscultation Heart: Regular rate and rhythm; no murmurs, rubs or bruits Abdomen: Soft, non tender and non distended. No masses, hepatosplenomegaly or hernias noted. Normal Bowel sounds Rectal: Deferred to colonoscopy Musculoskeletal: Symmetrical with no gross deformities  Skin: No lesions on visible extremities Pulses:  Normal pulses noted Extremities: No clubbing, cyanosis, edema or deformities noted Neurological: Alert oriented x 4, grossly nonfocal Cervical Nodes:  No significant cervical adenopathy Inguinal Nodes: No significant inguinal adenopathy Psychological:  Alert and cooperative. Normal mood and affect  Assessment and Recommendations:  1. Change in bowel habits with slightly looser stools, hematochezia, epigastric pain, early satiety, bloating. The underlying etiology is not entirely clear. Rule out colorectal lesions, hemorrhoids, inflammatory bowel disease, GERD, ulcer disease, celiac disease and other disorders. Multimedia programmer daily. Celiac panel. TSH and hepatic function panel today. The risks, benefits, and alternatives to colonoscopy with possible biopsy and possible polypectomy were discussed with the patient and they consent to proceed. The risks, benefits, and alternatives to endoscopy with possible biopsy and possible dilation were discussed with the patient and they consent to proceed.   2. Hyponatremia. Etiology unclear.  3. Cough, shortness of breath and abnormal chest CT. Further evaluation per Dr. Lovell Sheehan

## 2011-01-10 ENCOUNTER — Encounter: Payer: Self-pay | Admitting: Gastroenterology

## 2011-01-10 LAB — CELIAC PANEL 10
Gliadin IgA: 3.8 U/mL (ref <20)
Tissue Transglut Ab: 6 U/mL (ref <20)

## 2011-01-10 NOTE — Telephone Encounter (Signed)
LMTCB

## 2011-01-11 NOTE — Telephone Encounter (Signed)
Pick up meds are pharmacy- they are ready- ct results given per dr Lovell Sheehan- if not better may need to refer to pulmonary

## 2011-01-11 NOTE — Telephone Encounter (Signed)
Pt went to [facility] on [address] and [address] to pick up script, and was told by pharmacy that Dr Lovell Sheehan did not prescribed med, that it was an OTC med and did not needed to be filled by pharmacist. Pt wanted to know if it is otc med or not. Also Pt req results from CT Scan

## 2011-02-22 ENCOUNTER — Encounter: Payer: Self-pay | Admitting: Internal Medicine

## 2011-02-22 ENCOUNTER — Ambulatory Visit (INDEPENDENT_AMBULATORY_CARE_PROVIDER_SITE_OTHER): Payer: BC Managed Care – PPO | Admitting: Internal Medicine

## 2011-02-22 DIAGNOSIS — Z7289 Other problems related to lifestyle: Secondary | ICD-10-CM

## 2011-02-22 DIAGNOSIS — N410 Acute prostatitis: Secondary | ICD-10-CM

## 2011-02-22 DIAGNOSIS — R200 Anesthesia of skin: Secondary | ICD-10-CM

## 2011-02-22 DIAGNOSIS — J45901 Unspecified asthma with (acute) exacerbation: Secondary | ICD-10-CM

## 2011-02-22 DIAGNOSIS — F17200 Nicotine dependence, unspecified, uncomplicated: Secondary | ICD-10-CM

## 2011-02-22 DIAGNOSIS — J45909 Unspecified asthma, uncomplicated: Secondary | ICD-10-CM

## 2011-02-22 DIAGNOSIS — G44009 Cluster headache syndrome, unspecified, not intractable: Secondary | ICD-10-CM

## 2011-02-22 DIAGNOSIS — R209 Unspecified disturbances of skin sensation: Secondary | ICD-10-CM

## 2011-02-22 MED ORDER — DOXYCYCLINE HYCLATE 100 MG PO TABS: 100.0000 mg | ORAL_TABLET | Freq: Two times a day (BID) | ORAL | Status: AC

## 2011-02-22 MED ORDER — METHYLPREDNISOLONE (PAK) 4 MG PO TABS: 4.0000 mg | ORAL_TABLET | Freq: Every day | ORAL | Status: DC

## 2011-02-22 NOTE — Progress Notes (Signed)
  Subjective:    Patient ID: ADNAN VANVOORHIS, male    DOB: 18-Dec-1965, 45 y.o.   MRN: [id number]  HPI    Review of Systems  Constitutional: Negative for fever and fatigue.  HENT: Negative for hearing loss, congestion, neck pain and postnasal drip.   Eyes: Negative for discharge, redness and visual disturbance.  Respiratory: Negative for cough, shortness of breath and wheezing.   Cardiovascular: Negative for leg swelling.  Gastrointestinal: Negative for abdominal pain, constipation and abdominal distention.  Genitourinary: Negative for urgency and frequency.  Musculoskeletal: Negative for joint swelling and arthralgias.  Skin: Negative for color change and rash.  Neurological: Negative for weakness and light-headedness.  Hematological: Negative for adenopathy.  Psychiatric/Behavioral: Negative for behavioral problems.       Past Medical History  Diagnosis Date  . Allergy   . GERD (gastroesophageal reflux disease)   . Hemorrhoids   . Arthritis   . Colon polyps   . Headache   . Hyperlipidemia   . Pneumonia    Past Surgical History  Procedure Date  . Appendectomy   . Rotator cuff repair     right  . Tonsillectomy     reports that he has been smoking Cigarettes.  He has been smoking about 2 packs per day. He does not have any smokeless tobacco history on file. He reports that he drinks alcohol. He reports that he does not use illicit drugs. family history includes Breast cancer in his mother; Colon polyps in his maternal grandmother; Diabetes in his maternal grandmother; Heart disease in his maternal grandfather; Irritable bowel syndrome in his mother; Liver disease in his maternal grandfather; and Ovarian cancer in his mother.  There is no history of Colon cancer. No Known Allergies  Objective:   Physical Exam  Constitutional: He is oriented to person, place, and time. He appears well-developed and well-nourished.  HENT:  Head: Normocephalic and atraumatic.  Eyes:  Conjunctivae are normal. Pupils are equal, round, and reactive to light.  Neck: Normal range of motion. Neck supple.  Cardiovascular: Normal rate and regular rhythm.   Pulmonary/Chest: Effort normal and breath sounds normal.  Abdominal: Soft. Bowel sounds are normal.  Neurological: He is alert and oriented to person, place, and time.          Assessment & Plan:  Will write for detox protocol And will refer for intensive therapy Asthma flair Medrol and doxycycline for flair of asthma

## 2011-02-22 NOTE — Assessment & Plan Note (Signed)
I believe that this is probably C5 over C6 nerve root impingement on the right resulting in numbness and tingling in the arm.  We will address this with neck positioning and relaxation prior to bedtime to see if we can alleviate the symptoms if we cannot then and imaging of the neck would be the next step

## 2011-02-22 NOTE — Assessment & Plan Note (Signed)
The pt is ready to consider medical detox Will refer for out pt detox

## 2011-02-28 ENCOUNTER — Encounter: Payer: BC Managed Care – PPO | Admitting: Gastroenterology

## 2011-03-24 ENCOUNTER — Other Ambulatory Visit: Payer: Self-pay | Admitting: Internal Medicine

## 2011-04-21 ENCOUNTER — Ambulatory Visit: Payer: BC Managed Care – PPO | Admitting: Internal Medicine

## 2011-06-06 ENCOUNTER — Encounter: Payer: Self-pay | Admitting: Internal Medicine

## 2011-06-06 ENCOUNTER — Ambulatory Visit (INDEPENDENT_AMBULATORY_CARE_PROVIDER_SITE_OTHER): Payer: BC Managed Care – PPO | Admitting: Internal Medicine

## 2011-06-06 VITALS — BP 136/80 | HR 76 | Temp 98.2°F | Resp 16 | Ht 70.0 in | Wt 178.0 lb

## 2011-06-06 DIAGNOSIS — J4489 Other specified chronic obstructive pulmonary disease: Secondary | ICD-10-CM

## 2011-06-06 DIAGNOSIS — R05 Cough: Secondary | ICD-10-CM

## 2011-06-06 DIAGNOSIS — J310 Chronic rhinitis: Secondary | ICD-10-CM

## 2011-06-06 DIAGNOSIS — R059 Cough, unspecified: Secondary | ICD-10-CM

## 2011-06-06 DIAGNOSIS — J449 Chronic obstructive pulmonary disease, unspecified: Secondary | ICD-10-CM

## 2011-06-06 DIAGNOSIS — F17200 Nicotine dependence, unspecified, uncomplicated: Secondary | ICD-10-CM

## 2011-06-06 MED ORDER — FLUTICASONE-SALMETEROL 100-50 MCG/DOSE IN AEPB: 1.0000 | INHALATION_SPRAY | Freq: Two times a day (BID) | RESPIRATORY_TRACT | Status: DC

## 2011-06-06 MED ORDER — MOMETASONE FUROATE 50 MCG/ACT NA SUSP: 2.0000 | Freq: Every day | NASAL | Status: DC

## 2011-06-06 NOTE — Patient Instructions (Signed)
The patient is instructed to continue all medications as prescribed. Schedule followup with check out clerk upon leaving the clinic  

## 2011-07-03 ENCOUNTER — Other Ambulatory Visit: Payer: Self-pay | Admitting: Internal Medicine

## 2011-07-04 ENCOUNTER — Other Ambulatory Visit: Payer: Self-pay | Admitting: Internal Medicine

## 2011-07-25 ENCOUNTER — Telehealth: Payer: Self-pay | Admitting: Internal Medicine

## 2011-07-25 MED ORDER — AZITHROMYCIN 250 MG PO TABS: ORAL_TABLET | ORAL | Status: AC

## 2011-07-25 NOTE — Telephone Encounter (Signed)
Notified pt. 

## 2011-07-25 NOTE — Telephone Encounter (Signed)
Pt is experiencing a sinus infection  And is requesting to have something called in [facility] [address]

## 2011-07-25 NOTE — Telephone Encounter (Signed)
May have z pack per dr jenkins 

## 2011-08-23 NOTE — Progress Notes (Signed)
  Subjective:    Patient ID: Joshua Manning, male    DOB: April 11, 1966, 46 y.o.   MRN: [id number]  HPI    Review of Systems     Objective:   Physical Exam        Assessment & Plan:

## 2011-09-06 ENCOUNTER — Ambulatory Visit: Payer: BC Managed Care – PPO | Admitting: Internal Medicine

## 2011-10-17 NOTE — Progress Notes (Signed)
Subjective:    Patient ID: Joshua Manning, male    DOB: 1966/05/12, 46 y.o.   MRN: [id number]  HPI Patient is a 46 year old white male who is followed for hyperlipidemia gastroesophageal reflux and chronic insomnia.  Has a history of a cluster headache syndrome that is multifactorial I believe that alcohol consumption plays a role in his headaches as well as in his generalized pattern of insomnia.  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  HENT: Negative for hearing loss, congestion, neck pain and postnasal drip.   Eyes: Negative for discharge, redness and visual disturbance.  Respiratory: Negative for cough, shortness of breath and wheezing.   Cardiovascular: Negative for leg swelling.  Gastrointestinal: Positive for abdominal pain and anal bleeding. Negative for constipation and abdominal distention.  Genitourinary: Negative for urgency and frequency.  Musculoskeletal: Negative for joint swelling and arthralgias.  Skin: Negative for color change and rash.  Neurological: Negative for weakness and light-headedness.  Hematological: Negative for adenopathy.  Psychiatric/Behavioral: Negative for behavioral problems.   Past Medical History  Diagnosis Date  . Allergy   . GERD (gastroesophageal reflux disease)   . Hemorrhoids   . Arthritis   . Colon polyps   . Headache   . Hyperlipidemia   . Pneumonia     History   Social History  . Marital Status: Single    Spouse Name: N/A    Number of Children: N/A  . Years of Education: N/A   Occupational History  . Holiday representative    Social History Main Topics  . Smoking status: Current Everyday Smoker -- 2.0 packs/day    Types: Cigarettes  . Smokeless tobacco: Not on file  . Alcohol Use: Yes     4-6 beers daily  . Drug Use: No  . Sexually Active: Yes   Other  Topics Concern  . Not on file   Social History Narrative  . No narrative on file    Past Surgical History  Procedure Date  . Appendectomy   . Rotator cuff repair     right  . Tonsillectomy     Family History  Problem Relation Age of Onset  . Ovarian cancer Mother   . Breast cancer Mother   . Colon cancer Neg Hx   . Colon polyps Maternal Grandmother   . Diabetes Maternal Grandmother   . Heart disease Maternal Grandfather   . Liver disease Maternal Grandfather   . Irritable bowel syndrome Mother     No Known Allergies  Current Outpatient Prescriptions on File Prior to Visit  Medication Sig Dispense Refill  . diazepam (VALIUM) 10 MG tablet Take 10 mg by mouth at bedtime as needed.        . Fluticasone-Salmeterol (ADVAIR DISKUS) 100-50 MCG/DOSE AEPB Inhale 1 puff into the lungs 2 (two) times daily.  60 each  11  . mometasone (NASONEX) 50 MCG/ACT nasal spray Place 2 sprays into the nose daily.  17 g  12  . Multiple Vitamin (MULTIVITAMIN) capsule Take 1 capsule by mouth daily.        . rosuvastatin (CRESTOR) 20 MG tablet Take 20 mg by mouth daily.          BP 136/80  Pulse 76  Temp 98.2 F (36.8 C)  Resp 16  Ht 5\' 10"  (1.778 m)  Wt 178 lb (80.74 kg)  BMI 25.54 kg/m2       Objective:   Physical Exam  Nursing note and vitals reviewed. Constitutional: He appears well-developed and well-nourished.  HENT:  Head: Normocephalic and atraumatic.  Eyes: Conjunctivae are normal. Pupils are equal, round, and reactive to light.  Neck: Normal range of motion. Neck supple.  Cardiovascular: Normal rate and regular rhythm.   Pulmonary/Chest: Effort normal and breath sounds normal.  Abdominal: Soft. Bowel sounds are normal.  Genitourinary:        External hemorrhoids          Assessment & Plan:  [place] modification of his alcohol use will be a key to his long-term treatment.  I believe that alcohol worsens his cluster headaches certainly affects his hyperlipidemia  he is a primary cause for his chronic insomnia and may even play a role in his acute on chronic prostatitis  He is also present role of erectile dysfunction for which she is requesting Cialis or Levitra.  He has chronic asthmatic bronchitis that is secondary to continued tobacco abuse he smokes 2 packs per day.  I would recommend gradual absence of cigarettes consideration for alcohol treatment program and continued use of Crestor for hyperlipidemia due to his increased risk factors of smoking lipids and age

## 2011-11-23 ENCOUNTER — Other Ambulatory Visit: Payer: Self-pay | Admitting: Internal Medicine

## 2012-01-21 ENCOUNTER — Other Ambulatory Visit: Payer: Self-pay | Admitting: Internal Medicine

## 2012-02-16 ENCOUNTER — Ambulatory Visit (INDEPENDENT_AMBULATORY_CARE_PROVIDER_SITE_OTHER): Payer: BC Managed Care – PPO | Admitting: Psychology

## 2012-02-16 DIAGNOSIS — F331 Major depressive disorder, recurrent, moderate: Secondary | ICD-10-CM

## 2012-02-19 ENCOUNTER — Ambulatory Visit (INDEPENDENT_AMBULATORY_CARE_PROVIDER_SITE_OTHER): Payer: BC Managed Care – PPO | Admitting: Psychology

## 2012-02-19 DIAGNOSIS — F331 Major depressive disorder, recurrent, moderate: Secondary | ICD-10-CM

## 2012-02-22 ENCOUNTER — Ambulatory Visit: Payer: BC Managed Care – PPO | Admitting: Psychology

## 2012-05-14 ENCOUNTER — Telehealth: Payer: Self-pay | Admitting: Family Medicine

## 2012-05-14 NOTE — Telephone Encounter (Signed)
Per dr Lovell Sheehan- make crestor qod-  Left message on machine

## 2012-05-14 NOTE — Telephone Encounter (Signed)
Per dr Lovell Sheehan- hold crestor until physical-pt informed

## 2012-05-14 NOTE — Telephone Encounter (Signed)
Pt has been on Crestor for some time, but feel increasingly fatigued over the last 6mos. Has read that this can happen w/Crestor, and wonders if it's related. He has appt for cpx 07/22/12, but wondering what to do about Crestor - should try a diff cholesterol med, etc? Having labs done pre-cpx, so he wonders if his cholesterol if will be low enough that he could come off the statin. Please advise and call pt.

## 2012-07-15 ENCOUNTER — Other Ambulatory Visit (INDEPENDENT_AMBULATORY_CARE_PROVIDER_SITE_OTHER): Payer: BC Managed Care – PPO

## 2012-07-15 DIAGNOSIS — Z0000 Encounter for general adult medical examination without abnormal findings: Secondary | ICD-10-CM

## 2012-07-15 LAB — POCT URINALYSIS DIPSTICK
Blood, UA: NEGATIVE
Ketones, UA: NEGATIVE
Leukocytes, UA: NEGATIVE
Protein, UA: NEGATIVE
Spec Grav, UA: 1.02
pH, UA: 7

## 2012-07-15 LAB — LIPID PANEL
HDL: 37.8 mg/dL — ABNORMAL LOW (ref >39.00)
Total CHOL/HDL Ratio: 7
VLDL: 41.8 mg/dL — ABNORMAL HIGH (ref 0.0–40.0)

## 2012-07-15 LAB — HEPATIC FUNCTION PANEL
ALT: 39 U/L (ref 0–53)
AST: 22 U/L (ref 0–37)
Bilirubin, Direct: 0 mg/dL (ref 0.0–0.3)
Total Protein: 7.4 g/dL (ref 6.0–8.3)

## 2012-07-15 LAB — LDL CHOLESTEROL, DIRECT: Direct LDL: 175 mg/dL

## 2012-07-15 LAB — BASIC METABOLIC PANEL
Chloride: 101 mEq/L (ref 96–112)
Potassium: 4.4 mEq/L (ref 3.5–5.1)

## 2012-07-16 LAB — CBC WITH DIFFERENTIAL/PLATELET
Basophils Relative: 0.1 % (ref 0.0–3.0)
Eosinophils Relative: 3.9 % (ref 0.0–5.0)
HCT: 45.7 % (ref 39.0–52.0)
MCV: 99.6 fl (ref 78.0–100.0)
Monocytes Absolute: 0.6 10*3/uL (ref 0.1–1.0)
Monocytes Relative: 6.4 % (ref 3.0–12.0)
Neutrophils Relative %: 68.3 % (ref 43.0–77.0)
RBC: 4.59 Mil/uL (ref 4.22–5.81)
WBC: 8.8 10*3/uL (ref 4.5–10.5)

## 2012-07-22 ENCOUNTER — Encounter: Payer: Self-pay | Admitting: Internal Medicine

## 2012-07-22 ENCOUNTER — Ambulatory Visit (INDEPENDENT_AMBULATORY_CARE_PROVIDER_SITE_OTHER): Payer: BC Managed Care – PPO | Admitting: Internal Medicine

## 2012-07-22 VITALS — BP 130/80 | HR 76 | Temp 98.3°F | Resp 16 | Ht 70.0 in | Wt 184.0 lb

## 2012-07-22 DIAGNOSIS — Z0000 Encounter for general adult medical examination without abnormal findings: Secondary | ICD-10-CM

## 2012-07-22 DIAGNOSIS — F329 Major depressive disorder, single episode, unspecified: Secondary | ICD-10-CM

## 2012-07-22 MED ORDER — BUPROPION HCL ER (XL) 150 MG PO TB24: 150.0000 mg | ORAL_TABLET | Freq: Every day | ORAL | Status: DC

## 2012-07-22 NOTE — Progress Notes (Signed)
Subjective:    Patient ID: Joshua Manning, male    DOB: May 06, 1966, 47 y.o.   MRN: [id number]  HPI Needs to resume the 20 mg of crestor twice a week Psoriasis? Treated by dermatology Fatigue? Depression?   Review of Systems  Constitutional: Positive for fatigue. Negative for fever.  HENT: Negative for hearing loss, congestion, neck pain and postnasal drip.   Eyes: Negative for discharge, redness and visual disturbance.  Respiratory: Negative for cough, shortness of breath and wheezing.        Chest wall tenderness  Cardiovascular: Negative for leg swelling.  Gastrointestinal: Negative for abdominal pain, constipation and abdominal distention.  Genitourinary: Negative for urgency and frequency.  Musculoskeletal: Negative for joint swelling and arthralgias.  Skin: Negative for color change and rash.  Neurological: Positive for weakness. Negative for light-headedness.  Hematological: Negative for adenopathy.  Psychiatric/Behavioral: Negative for behavioral problems.   Past Medical History  Diagnosis Date  . Allergy   . GERD (gastroesophageal reflux disease)   . Hemorrhoids   . Arthritis   . Colon polyps   . Headache   . Hyperlipidemia   . Pneumonia     History   Social History  . Marital Status: Single    Spouse Name: N/A    Number of Children: N/A  . Years of Education: N/A   Occupational History  . Holiday representative    Social History Main Topics  . Smoking status: Current Every Day Smoker -- 2.0 packs/day    Types: Cigarettes  . Smokeless tobacco: Not on file  . Alcohol Use: Yes     Comment: 4-6 beers daily  . Drug Use: No  . Sexually Active: Yes   Other Topics Concern  . Not on file   Social History Narrative  . No narrative on file    Past Surgical History  Procedure Date  . Appendectomy   . Rotator cuff repair     right  . Tonsillectomy     Family History  Problem Relation Age of Onset  . Ovarian cancer Mother   . Breast cancer Mother   .  Colon cancer Neg Hx   . Colon polyps Maternal Grandmother   . Diabetes Maternal Grandmother   . Heart disease Maternal Grandfather   . Liver disease Maternal Grandfather   . Irritable bowel syndrome Mother     No Known Allergies  Current Outpatient Prescriptions on File Prior to Visit  Medication Sig Dispense Refill  . CIALIS 5 MG tablet TAKE 1 TABLET BY MOUTH EVERY DAY AS NEEDED  6 tablet  0  . Multiple Vitamin (MULTIVITAMIN) capsule Take 1 capsule by mouth daily.        . rosuvastatin (CRESTOR) 20 MG tablet Take 20 mg by mouth daily.          BP 130/80  Pulse 76  Temp 98.3 F (36.8 C)  Resp 16  Ht 5\' 10"  (1.778 m)  Wt 184 lb (83.462 kg)  BMI 26.40 kg/m2       Objective:   Physical Exam  Nursing note and vitals reviewed. Constitutional: He appears well-developed and well-nourished.  HENT:  Head: Normocephalic and atraumatic.  Eyes: Conjunctivae normal are normal. Pupils are equal, round, and reactive to light.  Neck: Normal range of motion. Neck supple.  Cardiovascular: Normal rate and regular rhythm.   Pulmonary/Chest: Effort normal and breath sounds normal.  Abdominal: Soft. Bowel sounds are normal.  Genitourinary: Rectum normal and prostate normal.  Musculoskeletal: Normal range of motion.  He exhibits tenderness. He exhibits no edema.  Skin: Rash noted. There is erythema.       Solar changes          Assessment & Plan:   Patient presents for yearly preventative medicine examination.   all immunizations and health maintenance protocols were reviewed with the patient and they are up to date with these protocols.   screening laboratory values were reviewed with the patient including screening of hyperlipidemia PSA renal function and hepatic function.   There medications past medical history social history problem list and allergies were reviewed in detail.   Goals were established with regard to weight loss exercise diet in compliance with  medications   Resume lipid management  Wellbutrin for depression and fatigue

## 2012-07-22 NOTE — Patient Instructions (Signed)
The patient is instructed to continue all medications as prescribed. Schedule followup with check out clerk upon leaving the clinic  

## 2012-08-18 ENCOUNTER — Ambulatory Visit (INDEPENDENT_AMBULATORY_CARE_PROVIDER_SITE_OTHER): Payer: BC Managed Care – PPO | Admitting: Family Medicine

## 2012-08-18 VITALS — BP 121/84 | HR 80 | Temp 98.3°F | Resp 16 | Ht 69.0 in | Wt 184.0 lb

## 2012-08-18 DIAGNOSIS — R062 Wheezing: Secondary | ICD-10-CM

## 2012-08-18 DIAGNOSIS — J3489 Other specified disorders of nose and nasal sinuses: Secondary | ICD-10-CM

## 2012-08-18 DIAGNOSIS — J019 Acute sinusitis, unspecified: Secondary | ICD-10-CM

## 2012-08-18 MED ORDER — AMOXICILLIN-POT CLAVULANATE 875-125 MG PO TABS: 1.0000 | ORAL_TABLET | Freq: Two times a day (BID) | ORAL | Status: DC

## 2012-08-18 MED ORDER — IPRATROPIUM BROMIDE 0.03 % NA SOLN: 2.0000 | Freq: Two times a day (BID) | NASAL | Status: DC

## 2012-08-18 MED ORDER — METHYLPREDNISOLONE 4 MG PO KIT: PACK | ORAL | Status: DC

## 2012-08-18 NOTE — Progress Notes (Signed)
[facility]:  Office Visit  Chief Complaint:  Chief Complaint  Patient presents with  . Sinusitis    sinus pressure  . congestion  . Fatigue    HPI: Joshua Manning is a 47 y.o. male who complains of  2 week h/o sinus congestion, pressure behind eyes, yellow and green drainage, subjective fevers and chills. Has not been taking flonase, does not seem to work. Has a h/o allergies. + smoker, + allergies. Was given a trial of Advair and Flonase. Last labs for methotrexate was normal. He is on methotrexate for psoriasis.+ wheeze/sob at night   Past Medical History  Diagnosis Date  . Allergy   . GERD (gastroesophageal reflux disease)   . Hemorrhoids   . Arthritis   . Colon polyps   . Headache   . Hyperlipidemia   . Pneumonia    Past Surgical History  Procedure Date  . Appendectomy   . Rotator cuff repair     right  . Tonsillectomy    History   Social History  . Marital Status: Single    Spouse Name: N/A    Number of Children: N/A  . Years of Education: N/A   Occupational History  . Holiday representative    Social History Main Topics  . Smoking status: Current Every Day Smoker -- 2.0 packs/day    Types: Cigarettes  . Smokeless tobacco: None  . Alcohol Use: Yes     Comment: 4-6 beers daily  . Drug Use: No  . Sexually Active: Yes   Other Topics Concern  . None   Social History Narrative  . None   Family History  Problem Relation Age of Onset  . Ovarian cancer Mother   . Breast cancer Mother   . Colon cancer Neg Hx   . Colon polyps Maternal Grandmother   . Diabetes Maternal Grandmother   . Heart disease Maternal Grandfather   . Liver disease Maternal Grandfather   . Irritable bowel syndrome Mother    No Known Allergies Prior to Admission medications   Medication Sig Start Date End Date Taking? Authorizing Provider  halobetasol (ULTRAVATE) 0.05 % cream Apply topically as needed.   Yes Historical Provider, MD  methotrexate (RHEUMATREX) 10 MG  tablet Take 10 mg by mouth once a week. Caution: Chemotherapy. Protect from light.   Yes Historical Provider, MD  Multiple Vitamin (MULTIVITAMIN) capsule Take 1 capsule by mouth daily.     Yes Historical Provider, MD  rosuvastatin (CRESTOR) 10 MG tablet Take 10 mg by mouth daily.   Yes Historical Provider, MD  buPROPion (WELLBUTRIN XL) 150 MG 24 hr tablet Take 1 tablet (150 mg total) by mouth daily. 07/22/12   Stacie Glaze, MD  CIALIS 5 MG tablet TAKE 1 TABLET BY MOUTH EVERY DAY AS NEEDED 01/21/12   Stacie Glaze, MD  rosuvastatin (CRESTOR) 20 MG tablet Take 20 mg by mouth daily.      Historical Provider, MD     ROS: The patient denies  , night sweats, unintentional weight loss, chest pain, palpitations, nausea, vomiting, abdominal pain, dysuria, hematuria, melena, numbness, weakness, or tingling.   All other systems have been reviewed and were otherwise negative with the exception of those mentioned in the HPI and as above.    PHYSICAL EXAM: Filed Vitals:   08/18/12 1308  BP: 121/84  Pulse: 80  Temp: 98.3 F (36.8 C)  Resp: 16   Filed Vitals:   08/18/12 1308  Height: 5\' 9"  (1.753  m)  Weight: 184 lb (83.462 kg)   Body mass index is 27.17 kg/(m^2).  General: Alert, no acute distress HEENT:  Normocephalic, atraumatic, oropharynx patent. + bilateral frontal and maxillary sinus tenderness, TM nl , no  exudates Cardiovascular:  Regular rate and rhythm, no rubs murmurs or gallops.  No Carotid bruits, radial pulse intact. No pedal edema.  Respiratory: Clear to auscultation bilaterally.  No wheezes, rales, or rhonchi.  No cyanosis, no use of accessory musculature GI: No organomegaly, abdomen is soft and non-tender, positive bowel sounds.  No masses. Skin: No rashes. Neurologic: Facial musculature symmetric. Psychiatric: Patient is appropriate throughout our interaction. Lymphatic: No cervical lymphadenopathy Musculoskeletal: Gait intact.   LABS: Results for orders placed in visit on  07/15/12  BASIC METABOLIC PANEL      Component Value Range   Sodium 138  135 - 145 mEq/L   Potassium 4.4  3.5 - 5.1 mEq/L   Chloride 101  96 - 112 mEq/L   CO2 30  19 - 32 mEq/L   Glucose, Bld 87  70 - 99 mg/dL   BUN 11  6 - 23 mg/dL   Creatinine, Ser 0.9  0.4 - 1.5 mg/dL   Calcium 9.8  8.4 - 28.4 mg/dL   GFR 13.24  >40.10 mL/min  CBC WITH DIFFERENTIAL      Component Value Range   WBC 8.8  4.5 - 10.5 K/uL   RBC 4.59  4.22 - 5.81 Mil/uL   Hemoglobin 15.8  13.0 - 17.0 g/dL   HCT 27.2  53.6 - 64.4 %   MCV 99.6  78.0 - 100.0 fl   MCHC 34.5  30.0 - 36.0 g/dL   RDW 03.4  74.2 - 59.5 %   Platelets 259.0  150.0 - 400.0 K/uL   Neutrophils Relative 68.3  43.0 - 77.0 %   Lymphocytes Relative 21.3  12.0 - 46.0 %   Monocytes Relative 6.4  3.0 - 12.0 %   Eosinophils Relative 3.9  0.0 - 5.0 %   Basophils Relative 0.1  0.0 - 3.0 %   Neutro Abs 6.0  1.4 - 7.7 K/uL   Lymphs Abs 1.9  0.7 - 4.0 K/uL   Monocytes Absolute 0.6  0.1 - 1.0 K/uL   Eosinophils Absolute 0.3  0.0 - 0.7 K/uL   Basophils Absolute 0.0  0.0 - 0.1 K/uL  HEPATIC FUNCTION PANEL      Component Value Range   Total Bilirubin 0.6  0.3 - 1.2 mg/dL   Bilirubin, Direct 0.0  0.0 - 0.3 mg/dL   Alkaline Phosphatase 48  39 - 117 U/L   AST 22  0 - 37 U/L   ALT 39  0 - 53 U/L   Total Protein 7.4  6.0 - 8.3 g/dL   Albumin 4.3  3.5 - 5.2 g/dL  TSH      Component Value Range   TSH 1.70  0.35 - 5.50 uIU/mL  POCT URINALYSIS DIPSTICK      Component Value Range   Color, UA yellow     Clarity, UA clear     Glucose, UA n     Bilirubin, UA n     Ketones, UA n     Spec Grav, UA 1.020     Blood, UA n     pH, UA 7.0     Protein, UA n     Urobilinogen, UA 0.2     Nitrite, UA n     Leukocytes, UA Negative  LIPID PANEL      Component Value Range   Cholesterol 246 (*) 0 - 200 mg/dL   Triglycerides 161.0 (*) 0.0 - 149.0 mg/dL   HDL 96.04 (*) >54.09 mg/dL   VLDL 81.1 (*) 0.0 - 91.4 mg/dL   Total CHOL/HDL Ratio 7    LDL CHOLESTEROL,  DIRECT      Component Value Range   Direct LDL 175.0       EKG/XRAY:   Primary read interpreted by Dr. Conley Rolls at [facility]  [place] 560, 600 (estimated should be 590-622)   ASSESSMENT/PLAN: Encounter Diagnoses  Name Primary?  . Acute sinusitis Yes  . Nasal drainage    D/w patient risk and benefits of taking PCN abx with methotrexate. He would still like to go ahead and take the Augmentin knowing the risks Advise to c/w Advair Will rx Medrol dose pack, augmentin, atrovent NS and also albuterol prn wheezing/sob at night If have SEs with meds please call us ASAP F/u prn    Eual Lindstrom PHUONG, DO 08/18/2012 1:45 PM

## 2012-08-24 ENCOUNTER — Telehealth: Payer: Self-pay

## 2012-08-24 DIAGNOSIS — J069 Acute upper respiratory infection, unspecified: Secondary | ICD-10-CM

## 2012-08-24 DIAGNOSIS — R0602 Shortness of breath: Secondary | ICD-10-CM

## 2012-08-24 NOTE — Telephone Encounter (Signed)
Pt's wife states that pt still complains of chest congestion, coughing, and sinus pressure. Pt states that the medications that he was prescribed is not working. Please advise. Best# [phone] Darcus Austin)

## 2012-08-25 MED ORDER — ALBUTEROL SULFATE HFA 108 (90 BASE) MCG/ACT IN AERS: 2.0000 | INHALATION_SPRAY | Freq: Four times a day (QID) | RESPIRATORY_TRACT | Status: DC | PRN

## 2012-08-25 MED ORDER — MOXIFLOXACIN HCL 400 MG PO TABS: 400.0000 mg | ORAL_TABLET | Freq: Every day | ORAL | Status: DC

## 2012-08-25 NOTE — Telephone Encounter (Signed)
Spoke with wife and patient. Coughing up more, still drainage, sinus congestion, chest congestion 6/10  Augmentin. More productive cough. Deneis fevers, chills. + SOB. Working lots. Will rx Avelox and Albuterol. IF no improvement in 48-72 hours needs to f/u. He has not take Methotrexate for 1 week.  Advise to stay off methotrexate until finish abx. He is on methotrexate for psoriasis.

## 2012-08-25 NOTE — Telephone Encounter (Signed)
Pt's wife calling again to see if another provider can review pts chart since Dr.Le will not be until tomorrow, she states that her husband is still not any better. Please advise. Best# [phone]

## 2012-10-23 ENCOUNTER — Ambulatory Visit: Payer: BC Managed Care – PPO | Admitting: Internal Medicine

## 2013-01-22 ENCOUNTER — Other Ambulatory Visit: Payer: Self-pay | Admitting: Internal Medicine

## 2013-02-05 ENCOUNTER — Ambulatory Visit: Payer: BC Managed Care – PPO | Admitting: Internal Medicine

## 2013-02-19 ENCOUNTER — Encounter: Payer: Self-pay | Admitting: Internal Medicine

## 2013-02-19 ENCOUNTER — Ambulatory Visit (INDEPENDENT_AMBULATORY_CARE_PROVIDER_SITE_OTHER): Payer: BC Managed Care – PPO | Admitting: Internal Medicine

## 2013-02-19 VITALS — BP 122/80 | HR 72 | Temp 98.2°F | Resp 16 | Ht 69.0 in | Wt 176.0 lb

## 2013-02-19 DIAGNOSIS — J449 Chronic obstructive pulmonary disease, unspecified: Secondary | ICD-10-CM

## 2013-02-19 DIAGNOSIS — E785 Hyperlipidemia, unspecified: Secondary | ICD-10-CM

## 2013-02-19 DIAGNOSIS — G44009 Cluster headache syndrome, unspecified, not intractable: Secondary | ICD-10-CM

## 2013-02-19 DIAGNOSIS — R05 Cough: Secondary | ICD-10-CM

## 2013-02-19 DIAGNOSIS — K219 Gastro-esophageal reflux disease without esophagitis: Secondary | ICD-10-CM

## 2013-02-19 NOTE — Progress Notes (Signed)
Subjective:    Patient ID: Joshua Manning, male    DOB: 14-Sep-1965, 47 y.o.   MRN: [id number]  HPI  Has increased cough and congestion. Still smoking persistent cough. Using the electronic.  Review of Systems  Constitutional: Negative for fever and fatigue.  HENT: Negative for hearing loss, congestion, neck pain and postnasal drip.   Eyes: Negative for discharge, redness and visual disturbance.  Respiratory: Negative for cough, shortness of breath and wheezing.   Cardiovascular: Negative for leg swelling.  Gastrointestinal: Negative for abdominal pain, constipation and abdominal distention.  Genitourinary: Negative for urgency and frequency.  Musculoskeletal: Negative for joint swelling and arthralgias.  Skin: Negative for color change and rash.  Neurological: Negative for weakness and light-headedness.  Hematological: Negative for adenopathy.  Psychiatric/Behavioral: Negative for behavioral problems.   Past Medical History  Diagnosis Date  . Allergy   . GERD (gastroesophageal reflux disease)   . Hemorrhoids   . Arthritis   . Colon polyps   . Headache(784.0)   . Hyperlipidemia   . Pneumonia     History   Social History  . Marital Status: Single    Spouse Name: N/A    Number of Children: N/A  . Years of Education: N/A   Occupational History  . Holiday representative    Social History Main Topics  . Smoking status: Current Every Day Smoker -- 2.00 packs/day    Types: Cigarettes  . Smokeless tobacco: Not on file  . Alcohol Use: Yes     Comment: 4-6 beers daily  . Drug Use: No  . Sexually Active: Yes   Other Topics Concern  . Not on file   Social History Narrative  . No narrative on file    Past Surgical History  Procedure Laterality Date  . Appendectomy    . Rotator cuff repair      right  . Tonsillectomy      Family History  Problem Relation Age of Onset  . Ovarian cancer Mother   . Breast cancer Mother   . Colon cancer Neg Hx   . Colon polyps Maternal  Grandmother   . Diabetes Maternal Grandmother   . Heart disease Maternal Grandfather   . Liver disease Maternal Grandfather   . Irritable bowel syndrome Mother     No Known Allergies  Current Outpatient Prescriptions on File Prior to Visit  Medication Sig Dispense Refill  . albuterol (PROVENTIL HFA;VENTOLIN HFA) 108 (90 BASE) MCG/ACT inhaler Inhale 2 puffs into the lungs every 6 (six) hours as needed for wheezing.  1 Inhaler  0  . CIALIS 5 MG tablet TAKE 1 TABLET BY MOUTH EVERY DAY AS NEEDED  6 tablet  0  . halobetasol (ULTRAVATE) 0.05 % cream Apply topically as needed.      Marland Kitchen ipratropium (ATROVENT) 0.03 % nasal spray Place 2 sprays into the nose every 12 (twelve) hours.  30 mL  0  . Multiple Vitamin (MULTIVITAMIN) capsule Take 1 capsule by mouth daily.        . rosuvastatin (CRESTOR) 20 MG tablet Take 20 mg by mouth daily.         No current facility-administered medications on file prior to visit.    BP 122/80  Pulse 72  Temp(Src) 98.2 F (36.8 C)  Resp 16  Ht 5\' 9"  (1.753 m)  Wt 176 lb (79.833 kg)  BMI 25.98 kg/m2       Objective:   Physical Exam  Constitutional: He appears well-developed and well-nourished.  HENT:  Head: Normocephalic and atraumatic.  Eyes: Conjunctivae are normal. Pupils are equal, round, and reactive to light.  Neck: Normal range of motion. Neck supple.  Cardiovascular: Normal rate and regular rhythm.   Pulmonary/Chest: Effort normal and breath sounds normal.  Abdominal: Soft. Bowel sounds are normal.          Assessment & Plan:  Neck numbness and arm pain due to cervical disc and has been using chiropractor. discussed fatty liver  We'll begin maintenance inhaler for early stage COPD And again address smoking cessation Continued Crestor samples given

## 2013-02-19 NOTE — Patient Instructions (Signed)
The patient is instructed to continue all medications as prescribed. Schedule followup with check out clerk upon leaving the clinic  

## 2013-03-11 IMAGING — CR DG CHEST 2V
2 series · 2 of 2 positions shown · non-contrast
Comparison: 03/16/2006

CLINICAL DATA: Cough with history of tobacco use.  Chest pain,
shortness of breath and congestion for 5-6 months.

CHEST - 2 VIEW

[view not recorded (1 of 2)]
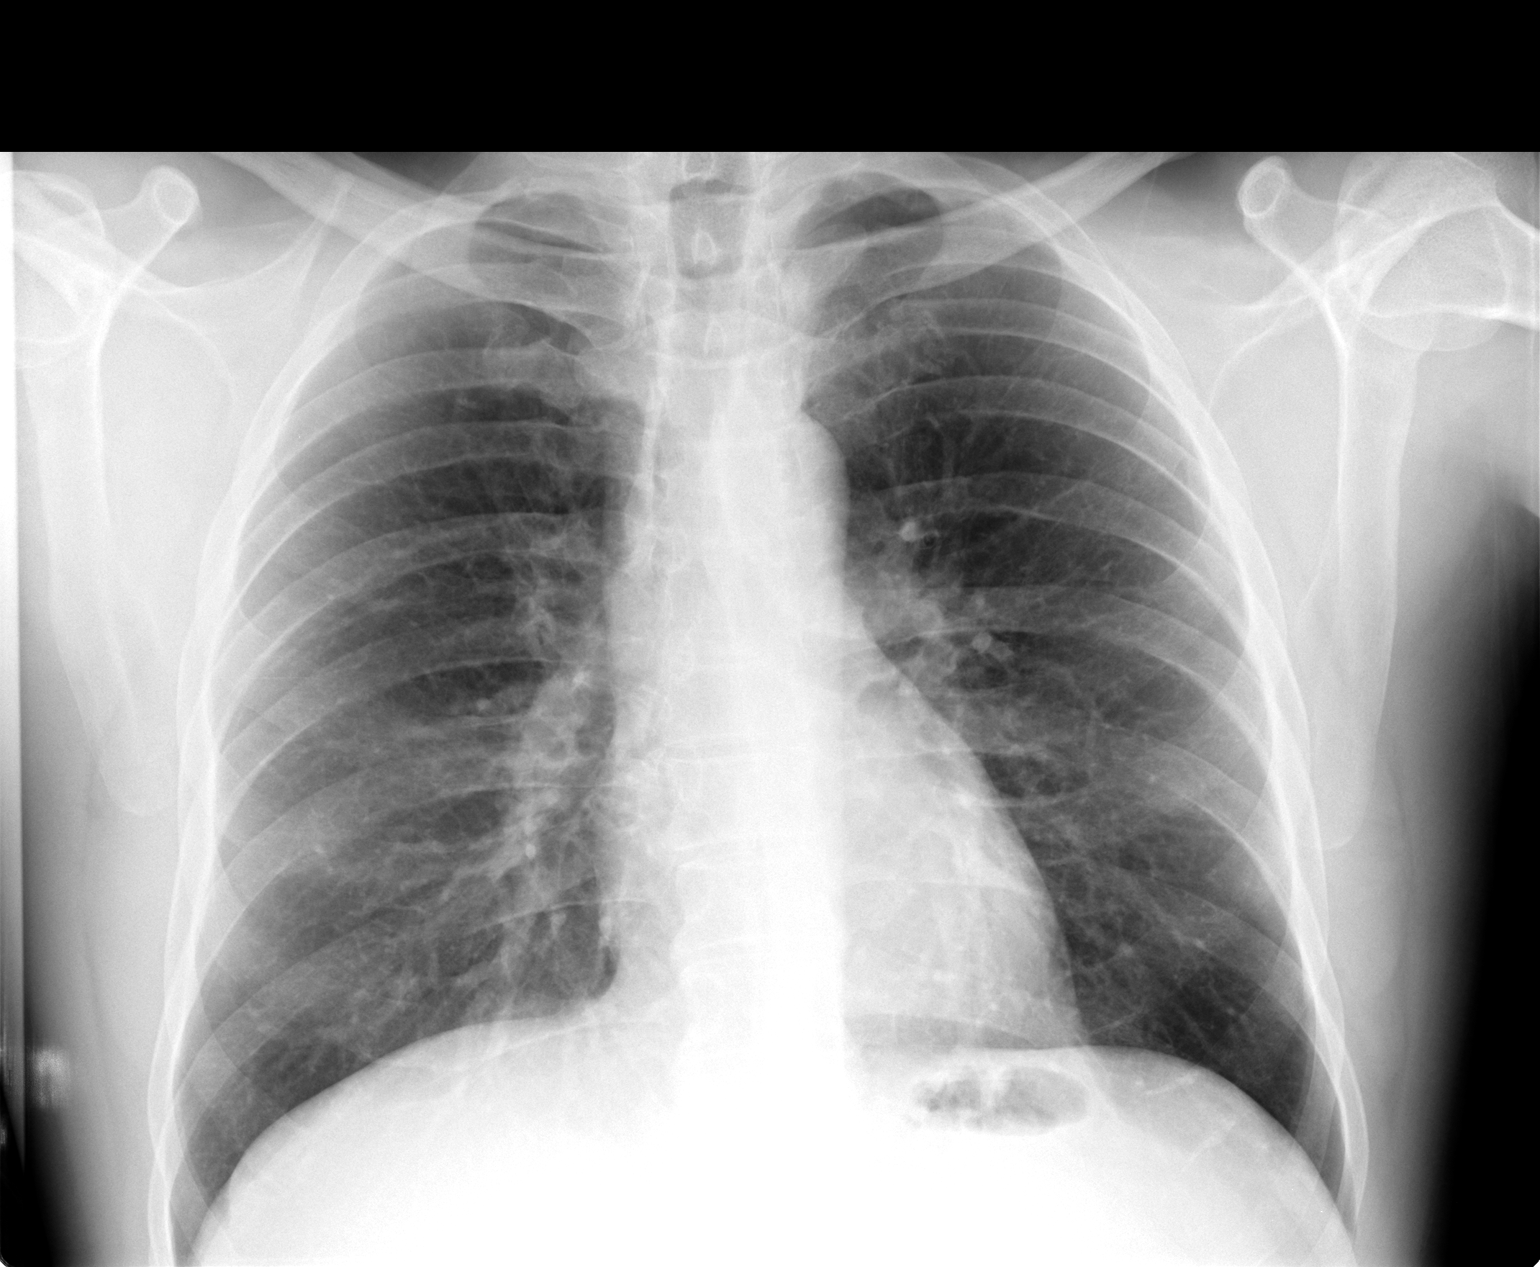

[view not recorded (2 of 2)]
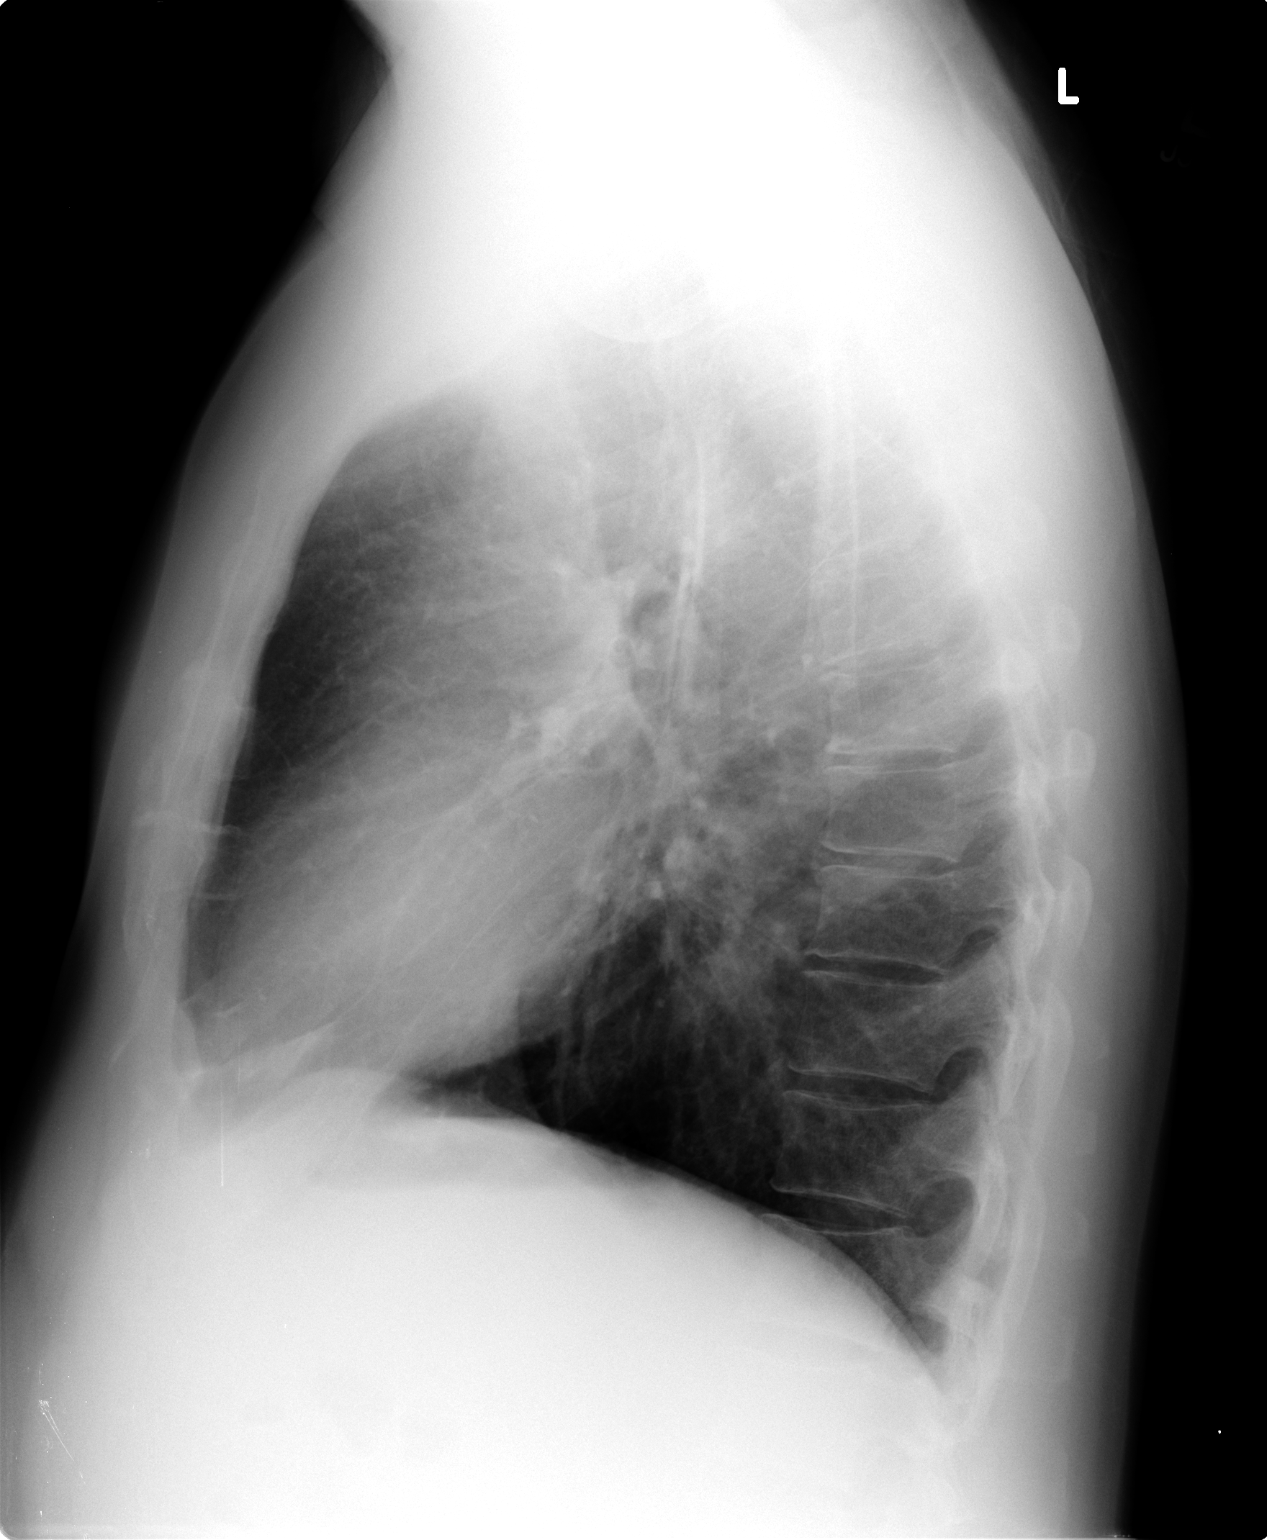

[2 of 2 positions shown; findings below may reference images not displayed]

FINDINGS: Heart and mediastinal contours are within normal limits.
Some mild central peribronchial cuffing is noted and would
correlate with the patient's history of smoking and underlying
bronchitic change.  No focal infiltrates or signs of congestive
failure are seen.  No pleural fluid is noted.

Bony structures appear intact.
IMPRESSION: Findings compatible with mild bronchitic change.  No new focal or
acute cardiopulmonary abnormality suggested.

## 2013-03-21 ENCOUNTER — Other Ambulatory Visit: Payer: Self-pay | Admitting: Internal Medicine

## 2013-03-27 ENCOUNTER — Telehealth: Payer: Self-pay | Admitting: Internal Medicine

## 2013-03-27 MED ORDER — TADALAFIL 5 MG PO TABS: ORAL_TABLET | ORAL | Status: DC

## 2013-03-27 NOTE — Telephone Encounter (Signed)
Pt had requested CIALIS 5 MG tablet, but pharm had filled levitra. Pt's inasurance will only pay for cialis. Can you resend? [facility]

## 2013-04-07 ENCOUNTER — Telehealth: Payer: Self-pay | Admitting: Internal Medicine

## 2013-04-07 MED ORDER — AVANAFIL 100 MG PO TABS: 100.0000 mL | ORAL_TABLET | Freq: Every evening | ORAL | Status: DC | PRN

## 2013-04-07 NOTE — Telephone Encounter (Signed)
Patient is aware, rx sent to pharmacy, [facility] upfront.

## 2013-04-07 NOTE — Telephone Encounter (Signed)
May try stendra 100 mg

## 2013-04-07 NOTE — Telephone Encounter (Signed)
Caller: Waylen/Patient; Phone: [phone]; Reason for Call: [place] states Cialis is not working.  Wants to know if Dr.  Lovell Sheehan can switch it to something else.  Please f/u with Fredrik Cove.

## 2013-04-08 ENCOUNTER — Ambulatory Visit: Payer: BC Managed Care – PPO | Admitting: Family Medicine

## 2013-04-08 DIAGNOSIS — Z0289 Encounter for other administrative examinations: Secondary | ICD-10-CM

## 2013-05-23 ENCOUNTER — Other Ambulatory Visit: Payer: Self-pay | Admitting: Internal Medicine

## 2013-05-26 ENCOUNTER — Telehealth: Payer: Self-pay | Admitting: Internal Medicine

## 2013-05-26 NOTE — Telephone Encounter (Signed)
Patient Information:  Caller Name: Dian  Phone: [phone]  Patient: Joshua Manning, Joshua Manning  Gender: Male  DOB: 04/03/1966  Age: 47 Years  PCP: Darryll Capers (Adults only)  Office Follow Up:  Does the office need to follow up with this patient?: Yes  Instructions For The Office: Is requesting antibiotic for sinus pain. States "gets bad fast" if not prescribed antibiotic. Has taken "over the counter sinus relief" medicine but has not helped.  RN Note:  States would like MD to call in antibiotic as he has done for 10 years. Advised can guarantee that he will but would ask. He is agreeable to be seen if MD advises this is what should do.  Symptoms  Reason For Call & Symptoms: Has pressure around sinus are since 11-9. Afebrile. States office usually caqlls in antibiotic.  Reviewed Health History In EMR: Yes  Reviewed Medications In EMR: Yes  Reviewed Allergies In EMR: Yes  Reviewed Surgeries / Procedures: Yes  Date of Onset of Symptoms: 05/25/2013  Guideline(s) Used:  Sinus Pain and Congestion  Disposition Per Guideline:   See Today or Tomorrow in Office  Reason For Disposition Reached:   Patient wants to be seen  Advice Given:  N/A  Patient Refused Recommendation:  Patient Requests Prescription  Wants antibiotic for sinus pain/pressure since 11-9.

## 2013-05-26 NOTE — Telephone Encounter (Signed)
Will see padonda tomorrow 

## 2013-05-27 ENCOUNTER — Encounter: Payer: Self-pay | Admitting: Family

## 2013-05-27 ENCOUNTER — Ambulatory Visit (INDEPENDENT_AMBULATORY_CARE_PROVIDER_SITE_OTHER): Payer: BC Managed Care – PPO | Admitting: Family

## 2013-05-27 VITALS — BP 130/86 | Temp 98.0°F | Wt 176.0 lb

## 2013-05-27 DIAGNOSIS — R05 Cough: Secondary | ICD-10-CM

## 2013-05-27 DIAGNOSIS — J019 Acute sinusitis, unspecified: Secondary | ICD-10-CM

## 2013-05-27 DIAGNOSIS — R059 Cough, unspecified: Secondary | ICD-10-CM

## 2013-05-27 MED ORDER — AMOXICILLIN-POT CLAVULANATE 875-125 MG PO TABS: 1.0000 | ORAL_TABLET | Freq: Two times a day (BID) | ORAL | Status: DC

## 2013-05-27 MED ORDER — METHYLPREDNISOLONE 4 MG PO KIT: PACK | ORAL | Status: AC

## 2013-05-27 NOTE — Progress Notes (Signed)
Subjective:    Patient ID: Joshua Manning, male    DOB: 04/24/1966, 47 y.o.   MRN: [id number]  HPI 47 year old WM, smoker of 1 1/2ppd, patient of Dr. Lovell Sheehan is in today with c/o sinus pressure, congestion, cough x1 week and worsening. Has been using a nasal spray with no relief. Has a history of sinusitis.   Past Medical History  Diagnosis Date  . Allergy   . GERD (gastroesophageal reflux disease)   . Hemorrhoids   . Arthritis   . Colon polyps   . Headache(784.0)   . Hyperlipidemia   . Pneumonia     History   Social History  . Marital Status: Single    Spouse Name: N/A    Number of Children: N/A  . Years of Education: N/A   Occupational History  . Holiday representative    Social History Main Topics  . Smoking status: Current Every Day Smoker -- 2.00 packs/day    Types: Cigarettes  . Smokeless tobacco: Not on file  . Alcohol Use: Yes     Comment: 4-6 beers daily  . Drug Use: No  . Sexual Activity: Yes   Other Topics Concern  . Not on file   Social History Narrative  . No narrative on file    Past Surgical History  Procedure Laterality Date  . Appendectomy    . Rotator cuff repair      right  . Tonsillectomy      Family History  Problem Relation Age of Onset  . Ovarian cancer Mother   . Breast cancer Mother   . Colon cancer Neg Hx   . Colon polyps Maternal Grandmother   . Diabetes Maternal Grandmother   . Heart disease Maternal Grandfather   . Liver disease Maternal Grandfather   . Irritable bowel syndrome Mother     No Known Allergies  Current Outpatient Prescriptions on File Prior to Visit  Medication Sig Dispense Refill  . albuterol (PROVENTIL HFA;VENTOLIN HFA) 108 (90 BASE) MCG/ACT inhaler Inhale 2 puffs into the lungs every 6 (six) hours as needed for wheezing.  1 Inhaler  0  . Avanafil (STENDRA) 100 MG TABS Take 100 mLs by mouth at bedtime as needed.  6 tablet  0  . halobetasol (ULTRAVATE) 0.05 % cream Apply topically as needed.      Marland Kitchen  ipratropium (ATROVENT) 0.03 % nasal spray Place 2 sprays into the nose every 12 (twelve) hours.  30 mL  0  . LEVITRA 20 MG tablet TAKE 1 TABLET BY MOUTH AS DIRECTED  5 tablet  3  . Multiple Vitamin (MULTIVITAMIN) capsule Take 1 capsule by mouth daily.        . rosuvastatin (CRESTOR) 20 MG tablet Take 20 mg by mouth daily.        . tadalafil (CIALIS) 5 MG tablet TAKE 1 TABLET BY MOUTH EVERY DAY AS NEEDED  6 tablet  5   No current facility-administered medications on file prior to visit.    BP 130/86  Temp(Src) 98 F (36.7 C) (Oral)  Wt 176 lb (79.833 kg)chart   Review of Systems  Constitutional: Negative.   HENT: Positive for sinus pressure, sneezing and sore throat.   Respiratory: Positive for cough.   Cardiovascular: Negative.   Gastrointestinal: Negative.   Musculoskeletal: Negative.   Skin: Negative.   Allergic/Immunologic: Positive for environmental allergies.  Psychiatric/Behavioral: Negative.        Objective:   Physical Exam  Constitutional: He is oriented to person, place,  and time. He appears well-developed and well-nourished.  HENT:  Right Ear: External ear normal.  Left Ear: External ear normal.  Maxillary sinus tenderness to palpation bilaterally.   Neck: Normal range of motion. Neck supple.  Cardiovascular: Normal rate, regular rhythm and normal heart sounds.   Pulmonary/Chest: Effort normal and breath sounds normal.  Musculoskeletal: Normal range of motion.  Neurological: He is alert and oriented to person, place, and time.  Skin: Skin is warm and dry.  Psychiatric: He has a normal mood and affect.          Assessment & Plan:  Assessment: 1. Acute Sinusitis 2. Allergic Rhinitis  Plan: Augmentin 875mg  twice a day x 10 days. Medrol dosepak as directed. Call the office if symptoms worsen or persist. Recheck as scheduled and as needed.

## 2013-05-27 NOTE — Progress Notes (Signed)
Pre-visit discussion using our clinic review tool. No additional management support is needed unless otherwise documented below in the visit note.  

## 2013-05-27 NOTE — Patient Instructions (Signed)
Smoking Cessation Quitting smoking is important to your health and has many advantages. However, it is not always easy to quit since nicotine is a very addictive drug. Often times, people try 3 times or more before being able to quit. This document explains the best ways for you to prepare to quit smoking. Quitting takes hard work and a lot of effort, but you can do it. ADVANTAGES OF QUITTING SMOKING  You will live longer, feel better, and live better.  Your body will feel the impact of quitting smoking almost immediately.  Within 20 minutes, blood pressure decreases. Your pulse returns to its normal level.  After 8 hours, carbon monoxide levels in the blood return to normal. Your oxygen level increases.  After 24 hours, the chance of having a heart attack starts to decrease. Your breath, hair, and body stop smelling like smoke.  After 48 hours, damaged nerve endings begin to recover. Your sense of taste and smell improve.  After 72 hours, the body is virtually free of nicotine. Your bronchial tubes relax and breathing becomes easier.  After 2 to 12 weeks, lungs can hold more air. Exercise becomes easier and circulation improves.  The risk of having a heart attack, stroke, cancer, or lung disease is greatly reduced.  After 1 year, the risk of coronary heart disease is cut in half.  After 5 years, the risk of stroke falls to the same as a nonsmoker.  After 10 years, the risk of lung cancer is cut in half and the risk of other cancers decreases significantly.  After 15 years, the risk of coronary heart disease drops, usually to the level of a nonsmoker.  If you are pregnant, quitting smoking will improve your chances of having a healthy baby.  The people you live with, especially any children, will be healthier.  You will have extra money to spend on things other than cigarettes. QUESTIONS TO THINK ABOUT BEFORE ATTEMPTING TO QUIT You may want to talk about your answers with your  caregiver.  Why do you want to quit?  If you tried to quit in the past, what helped and what did not?  What will be the most difficult situations for you after you quit? How will you plan to handle them?  Who can help you through the tough times? Your family? Friends? A caregiver?  What pleasures do you get from smoking? What ways can you still get pleasure if you quit? Here are some questions to ask your caregiver:  How can you help me to be successful at quitting?  What medicine do you think would be best for me and how should I take it?  What should I do if I need more help?  What is smoking withdrawal like? How can I get information on withdrawal? GET READY  Set a quit date.  Change your environment by getting rid of all cigarettes, ashtrays, matches, and lighters in your home, car, or work. Do not let people smoke in your home.  Review your past attempts to quit. Think about what worked and what did not. GET SUPPORT AND ENCOURAGEMENT You have a better chance of being successful if you have help. You can get support in many ways.  Tell your family, friends, and co-workers that you are going to quit and need their support. Ask them not to smoke around you.  Get individual, group, or telephone counseling and support. Programs are available at local hospitals and health centers. Call your local health department for   information about programs in your area.  Spiritual beliefs and practices may help some smokers quit.  Download a "quit meter" on your computer to keep track of quit statistics, such as how long you have gone without smoking, cigarettes not smoked, and money saved.  Get a self-help book about quitting smoking and staying off of tobacco. LEARN NEW SKILLS AND BEHAVIORS  Distract yourself from urges to smoke. Talk to someone, go for a walk, or occupy your time with a task.  Change your normal routine. Take a different route to work. Drink tea instead of coffee.  Eat breakfast in a different place.  Reduce your stress. Take a hot bath, exercise, or read a book.  Plan something enjoyable to do every day. Reward yourself for not smoking.  Explore interactive web-based programs that specialize in helping you quit. GET MEDICINE AND USE IT CORRECTLY Medicines can help you stop smoking and decrease the urge to smoke. Combining medicine with the above behavioral methods and support can greatly increase your chances of successfully quitting smoking.  Nicotine replacement therapy helps deliver nicotine to your body without the negative effects and risks of smoking. Nicotine replacement therapy includes nicotine gum, lozenges, inhalers, nasal sprays, and skin patches. Some may be available over-the-counter and others require a prescription.  Antidepressant medicine helps people abstain from smoking, but how this works is unknown. This medicine is available by prescription.  Nicotinic receptor partial agonist medicine simulates the effect of nicotine in your brain. This medicine is available by prescription. Ask your caregiver for advice about which medicines to use and how to use them based on your health history. Your caregiver will tell you what side effects to look out for if you choose to be on a medicine or therapy. Carefully read the information on the package. Do not use any other product containing nicotine while using a nicotine replacement product.  RELAPSE OR DIFFICULT SITUATIONS Most relapses occur within the first 3 months after quitting. Do not be discouraged if you start smoking again. Remember, most people try several times before finally quitting. You may have symptoms of withdrawal because your body is used to nicotine. You may crave cigarettes, be irritable, feel very hungry, cough often, get headaches, or have difficulty concentrating. The withdrawal symptoms are only temporary. They are strongest when you first quit, but they will go away within  10 14 days. To reduce the chances of relapse, try to:  Avoid drinking alcohol. Drinking lowers your chances of successfully quitting.  Reduce the amount of caffeine you consume. Once you quit smoking, the amount of caffeine in your body increases and can give you symptoms, such as a rapid heartbeat, sweating, and anxiety.  Avoid smokers because they can make you want to smoke.  Do not let weight gain distract you. Many smokers will gain weight when they quit, usually less than 10 pounds. Eat a healthy diet and stay active. You can always lose the weight gained after you quit.  Find ways to improve your mood other than smoking. FOR MORE INFORMATION  www.smokefree.gov  Document Released: 06/27/2001 Document Revised: 01/02/2012 Document Reviewed: 10/12/2011 [employer] Patient Information 2014 [employer].  

## 2013-07-22 ENCOUNTER — Other Ambulatory Visit (INDEPENDENT_AMBULATORY_CARE_PROVIDER_SITE_OTHER): Payer: BC Managed Care – PPO

## 2013-07-22 DIAGNOSIS — Z0000 Encounter for general adult medical examination without abnormal findings: Secondary | ICD-10-CM

## 2013-07-22 LAB — BASIC METABOLIC PANEL
BUN: 7 mg/dL (ref 6–23)
CALCIUM: 9.6 mg/dL (ref 8.4–10.5)
CO2: 27 meq/L (ref 19–32)
CREATININE: 0.8 mg/dL (ref 0.4–1.5)
Chloride: 99 mEq/L (ref 96–112)
GFR: 107.92 mL/min (ref >60.00)
Glucose, Bld: 84 mg/dL (ref 70–99)
Potassium: 4 mEq/L (ref 3.5–5.1)
Sodium: 134 mEq/L — ABNORMAL LOW (ref 135–145)

## 2013-07-22 LAB — LIPID PANEL
CHOLESTEROL: 237 mg/dL — AB (ref 0–200)
HDL: 43.6 mg/dL (ref >39.00)
TRIGLYCERIDES: 223 mg/dL — AB (ref 0.0–149.0)
Total CHOL/HDL Ratio: 5
VLDL: 44.6 mg/dL — ABNORMAL HIGH (ref 0.0–40.0)

## 2013-07-22 LAB — POCT URINALYSIS DIPSTICK
Bilirubin, UA: NEGATIVE
Blood, UA: NEGATIVE
GLUCOSE UA: NEGATIVE
Ketones, UA: NEGATIVE
Nitrite, UA: NEGATIVE
PROTEIN UA: NEGATIVE
Spec Grav, UA: 1.015
Urobilinogen, UA: 0.2
pH, UA: 7

## 2013-07-22 LAB — CBC WITH DIFFERENTIAL/PLATELET
BASOS ABS: 0 10*3/uL (ref 0.0–0.1)
Basophils Relative: 0.1 % (ref 0.0–3.0)
EOS ABS: 0.5 10*3/uL (ref 0.0–0.7)
Eosinophils Relative: 4.3 % (ref 0.0–5.0)
HEMATOCRIT: 44.7 % (ref 39.0–52.0)
HEMOGLOBIN: 15.5 g/dL (ref 13.0–17.0)
LYMPHS ABS: 1.9 10*3/uL (ref 0.7–4.0)
Lymphocytes Relative: 17.5 % (ref 12.0–46.0)
MCHC: 34.6 g/dL (ref 30.0–36.0)
MCV: 97.4 fl (ref 78.0–100.0)
MONO ABS: 0.9 10*3/uL (ref 0.1–1.0)
Monocytes Relative: 8.7 % (ref 3.0–12.0)
NEUTROS ABS: 7.4 10*3/uL (ref 1.4–7.7)
Neutrophils Relative %: 69.4 % (ref 43.0–77.0)
Platelets: 275 10*3/uL (ref 150.0–400.0)
RBC: 4.59 Mil/uL (ref 4.22–5.81)
RDW: 13.4 % (ref 11.5–14.6)
WBC: 10.6 10*3/uL — ABNORMAL HIGH (ref 4.5–10.5)

## 2013-07-22 LAB — HEPATIC FUNCTION PANEL
ALBUMIN: 4.6 g/dL (ref 3.5–5.2)
ALT: 33 U/L (ref 0–53)
AST: 22 U/L (ref 0–37)
Alkaline Phosphatase: 51 U/L (ref 39–117)
BILIRUBIN TOTAL: 0.8 mg/dL (ref 0.3–1.2)
Bilirubin, Direct: 0 mg/dL (ref 0.0–0.3)
Total Protein: 7.2 g/dL (ref 6.0–8.3)

## 2013-07-22 LAB — TSH: TSH: 1.12 u[IU]/mL (ref 0.35–5.50)

## 2013-07-22 LAB — LDL CHOLESTEROL, DIRECT: Direct LDL: 149.9 mg/dL

## 2013-07-28 ENCOUNTER — Ambulatory Visit (INDEPENDENT_AMBULATORY_CARE_PROVIDER_SITE_OTHER): Payer: BC Managed Care – PPO | Admitting: Internal Medicine

## 2013-07-28 ENCOUNTER — Encounter: Payer: Self-pay | Admitting: Internal Medicine

## 2013-07-28 VITALS — BP 120/80 | HR 76 | Temp 98.6°F | Resp 16 | Ht 69.0 in | Wt 176.0 lb

## 2013-07-28 DIAGNOSIS — F17200 Nicotine dependence, unspecified, uncomplicated: Secondary | ICD-10-CM

## 2013-07-28 DIAGNOSIS — E785 Hyperlipidemia, unspecified: Secondary | ICD-10-CM

## 2013-07-28 DIAGNOSIS — N410 Acute prostatitis: Secondary | ICD-10-CM

## 2013-07-28 DIAGNOSIS — G4700 Insomnia, unspecified: Secondary | ICD-10-CM

## 2013-07-28 DIAGNOSIS — G44009 Cluster headache syndrome, unspecified, not intractable: Secondary | ICD-10-CM

## 2013-07-28 DIAGNOSIS — Z0000 Encounter for general adult medical examination without abnormal findings: Secondary | ICD-10-CM

## 2013-07-28 MED ORDER — ACLIDINIUM BROMIDE 400 MCG/ACT IN AEPB: 1.0000 | INHALATION_SPRAY | Freq: Two times a day (BID) | RESPIRATORY_TRACT | Status: DC

## 2013-07-28 MED ORDER — CIPROFLOXACIN HCL 500 MG PO TABS: 500.0000 mg | ORAL_TABLET | Freq: Two times a day (BID) | ORAL | Status: DC

## 2013-07-28 MED ORDER — SILDENAFIL CITRATE 100 MG PO TABS: 50.0000 mg | ORAL_TABLET | Freq: Every day | ORAL | Status: DC | PRN

## 2013-07-28 NOTE — Progress Notes (Signed)
CINA not available today  

## 2013-07-28 NOTE — Progress Notes (Signed)
Subjective:    Patient ID: Joshua Manning, male    DOB: 11/15/1965, 48 y.o.   MRN: [id number]  HPI discussion of smoking cessation nicotine addition Lipid management ED COPD stage 2 Cut back ETOH and diet drinks Increase the crestor Tender right testicle    Review of Systems  Constitutional: Negative for fever and fatigue.  HENT: Negative for congestion, hearing loss and postnasal drip.   Eyes: Negative for discharge, redness and visual disturbance.  Respiratory: Negative for cough, shortness of breath and wheezing.   Cardiovascular: Negative for leg swelling.  Gastrointestinal: Negative for abdominal pain, constipation and abdominal distention.  Genitourinary: Negative for urgency and frequency.  Musculoskeletal: Negative for arthralgias, joint swelling and neck pain.  Skin: Negative for color change and rash.  Neurological: Negative for weakness and light-headedness.  Hematological: Negative for adenopathy.  Psychiatric/Behavioral: Negative for behavioral problems.   Past Medical History  Diagnosis Date  . Allergy   . GERD (gastroesophageal reflux disease)   . Hemorrhoids   . Arthritis   . Colon polyps   . Headache(784.0)   . Hyperlipidemia   . Pneumonia     History   Social History  . Marital Status: Single    Spouse Name: N/A    Number of Children: N/A  . Years of Education: N/A   Occupational History  . Architect    Social History Main Topics  . Smoking status: Current Every Day Smoker -- 2.00 packs/day    Types: Cigarettes  . Smokeless tobacco: Not on file  . Alcohol Use: Yes     Comment: 4-6 beers daily  . Drug Use: No  . Sexual Activity: Yes   Other Topics Concern  . Not on file   Social History Narrative  . No narrative on file    Past Surgical History  Procedure Laterality Date  . Appendectomy    . Rotator cuff repair      right  . Tonsillectomy      Family History  Problem Relation Age of Onset  . Ovarian cancer Mother     . Breast cancer Mother   . Colon cancer Neg Hx   . Colon polyps Maternal Grandmother   . Diabetes Maternal Grandmother   . Heart disease Maternal Grandfather   . Liver disease Maternal Grandfather   . Irritable bowel syndrome Mother     No Known Allergies  Current Outpatient Prescriptions on File Prior to Visit  Medication Sig Dispense Refill  . albuterol (PROVENTIL HFA;VENTOLIN HFA) 108 (90 BASE) MCG/ACT inhaler Inhale 2 puffs into the lungs every 6 (six) hours as needed for wheezing.  1 Inhaler  0  . Avanafil (STENDRA) 100 MG TABS Take 100 mLs by mouth at bedtime as needed.  6 tablet  0  . halobetasol (ULTRAVATE) 0.05 % cream Apply topically as needed.      Marland Kitchen ipratropium (ATROVENT) 0.03 % nasal spray Place 2 sprays into the nose every 12 (twelve) hours.  30 mL  0  . Multiple Vitamin (MULTIVITAMIN) capsule Take 1 capsule by mouth daily.        . rosuvastatin (CRESTOR) 20 MG tablet Take 20 mg by mouth daily.         No current facility-administered medications on file prior to visit.    BP 120/80  Pulse 76  Temp(Src) 98.6 F (37 C)  Resp 16  Ht 5\' 9"  (1.753 m)  Wt 176 lb (79.833 kg)  BMI 25.98 kg/m2  Objective:   Physical Exam  Constitutional: He is oriented to person, place, and time. He appears well-developed and well-nourished.  HENT:  Head: Normocephalic and atraumatic.  Eyes: Conjunctivae are normal. Pupils are equal, round, and reactive to light.  Neck: Normal range of motion. Neck supple.  Cardiovascular: Normal rate and regular rhythm.   Pulmonary/Chest: Effort normal and breath sounds normal.  Abdominal: Soft. Bowel sounds are normal.  Genitourinary: Rectum normal and prostate normal.  Prostate boggy and tender Varicocele on the right testicle  Neurological: He is alert and oriented to person, place, and time.  Skin: Skin is warm and dry.  Psychiatric: He has a normal mood and affect. His behavior is normal.          Assessment & Plan:    Patient presents for yearly preventative medicine examination.   all immunizations and health maintenance protocols were reviewed with the patient and they are up to date with these protocols.   screening laboratory values were reviewed with the patient including screening of hyperlipidemia PSA renal function and hepatic function.   There medications past medical history social history problem list and allergies were reviewed in detail.   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

## 2013-08-16 ENCOUNTER — Telehealth: Payer: Self-pay | Admitting: Internal Medicine

## 2013-08-16 NOTE — Telephone Encounter (Signed)
Relevant patient education mailed to patient.  

## 2013-08-21 ENCOUNTER — Telehealth: Payer: Self-pay | Admitting: Internal Medicine

## 2013-08-21 ENCOUNTER — Other Ambulatory Visit: Payer: Self-pay | Admitting: *Deleted

## 2013-08-21 MED ORDER — SILDENAFIL CITRATE 100 MG PO TABS
50.0000 mg | ORAL_TABLET | Freq: Every day | ORAL | Status: DC | PRN
Start: 2013-08-21 — End: 2013-11-11

## 2013-08-21 NOTE — Telephone Encounter (Signed)
Pt needs new rx viagra ?mg call into [facility] [address]. Pt had samples

## 2013-09-01 ENCOUNTER — Telehealth: Payer: Self-pay | Admitting: Internal Medicine

## 2013-09-01 ENCOUNTER — Other Ambulatory Visit: Payer: Self-pay | Admitting: *Deleted

## 2013-09-01 MED ORDER — AZITHROMYCIN 250 MG PO TABS: ORAL_TABLET | ORAL | Status: DC

## 2013-09-01 MED ORDER — ROSUVASTATIN CALCIUM 20 MG PO TABS: 20.0000 mg | ORAL_TABLET | Freq: Every day | ORAL | Status: DC

## 2013-09-01 NOTE — Telephone Encounter (Signed)
may have z pack per drjenkins

## 2013-09-01 NOTE — Telephone Encounter (Signed)
Pt is needing new rx rosuvastatin (CRESTOR) 20 MG tablet and also wants to see if he can get an antibiotic called in for a sinus infection, sent [facility]  [address]

## 2013-09-15 ENCOUNTER — Telehealth: Payer: Self-pay | Admitting: Internal Medicine

## 2013-09-15 NOTE — Telephone Encounter (Signed)
Pt states he received a letter from his insurance stating Crestor isn't a preferred drug, he would like for you to change his medication to one of the following:  Pravastatin, Simvastatin, Lovastatin, or Atorvastatin. Normal and [place]

## 2013-09-22 MED ORDER — ATORVASTATIN CALCIUM 20 MG PO TABS: 20.0000 mg | ORAL_TABLET | Freq: Every day | ORAL | Status: DC

## 2013-09-22 NOTE — Telephone Encounter (Signed)
rx sent in electronically, pt aware 

## 2013-09-22 NOTE — Telephone Encounter (Signed)
Change to atorvastatin 20mg 

## 2013-10-06 ENCOUNTER — Telehealth: Payer: Self-pay | Admitting: Internal Medicine

## 2013-10-06 NOTE — Telephone Encounter (Signed)
Patient Information:  Caller Name: Jotham  Phone: [phone]  Patient: Joshua Manning, Joshua Manning  Gender: Male  DOB: 03-Oct-1965  Age: 48 Years  PCP: Benay Pillow (Adults only)  Office Follow Up:  Does the office need to follow up with this patient?: No  Instructions For The Office: N/A  RN Note:  Patient calling regarding Chest Pain & heaviness, SOB, and now pain radiating down left arm.   Patient declines 911 states he has someone with him and they will drive to ED [facility]  Symptoms  Reason For Call & Symptoms: chest tightness and pressure  Reviewed Health History In EMR: Yes  Reviewed Medications In EMR: Yes  Reviewed Allergies In EMR: Yes  Reviewed Surgeries / Procedures: Yes  Date of Onset of Symptoms: 10/05/2013  Guideline(s) Used:  Chest Pain  Disposition Per Guideline:   Call EMS 911 Now  Reason For Disposition Reached:   Chest pain lasting longer than 5 minutes and ANY of the following:  Over 66 years old Over 58 years old and at least one cardiac risk factor (i.e., high blood pressure, diabetes, high cholesterol, obesity, smoker or strong family history of heart disease) Pain is crushing, pressure-like, or heavy  Took nitroglycerin and chest pain was not relieved History of heart disease (i.e., angina, heart attack, bypass surgery, angioplasty, CHF)  Advice Given:  N/A  Patient Refused Recommendation:  Patient Will Go To ED  Patient refuses 911

## 2013-10-24 ENCOUNTER — Other Ambulatory Visit: Payer: Self-pay | Admitting: Internal Medicine

## 2013-11-11 ENCOUNTER — Emergency Department (HOSPITAL_COMMUNITY): Payer: No Typology Code available for payment source

## 2013-11-11 ENCOUNTER — Observation Stay (HOSPITAL_COMMUNITY)
Admission: EM | Admit: 2013-11-11 | Discharge: 2013-11-12 | Disposition: A | Discharge status: Home / Self Care | Payer: No Typology Code available for payment source | Attending: General Surgery | Admitting: General Surgery

## 2013-11-11 ENCOUNTER — Encounter (HOSPITAL_COMMUNITY): Payer: Self-pay | Admitting: *Deleted

## 2013-11-11 DIAGNOSIS — D62 Acute posthemorrhagic anemia: Secondary | ICD-10-CM | POA: Insufficient documentation

## 2013-11-11 DIAGNOSIS — Z860100 Personal history of colon polyps, unspecified: Secondary | ICD-10-CM | POA: Insufficient documentation

## 2013-11-11 DIAGNOSIS — K219 Gastro-esophageal reflux disease without esophagitis: Secondary | ICD-10-CM | POA: Insufficient documentation

## 2013-11-11 DIAGNOSIS — J309 Allergic rhinitis, unspecified: Secondary | ICD-10-CM | POA: Insufficient documentation

## 2013-11-11 DIAGNOSIS — S0180XA Unspecified open wound of other part of head, initial encounter: Principal | ICD-10-CM | POA: Insufficient documentation

## 2013-11-11 DIAGNOSIS — J45909 Unspecified asthma, uncomplicated: Secondary | ICD-10-CM | POA: Insufficient documentation

## 2013-11-11 DIAGNOSIS — Z1881 Retained glass fragments: Secondary | ICD-10-CM | POA: Insufficient documentation

## 2013-11-11 DIAGNOSIS — D500 Iron deficiency anemia secondary to blood loss (chronic): Secondary | ICD-10-CM

## 2013-11-11 DIAGNOSIS — S0085XA Superficial foreign body of other part of head, initial encounter: Secondary | ICD-10-CM | POA: Diagnosis present

## 2013-11-11 DIAGNOSIS — F17200 Nicotine dependence, unspecified, uncomplicated: Secondary | ICD-10-CM | POA: Insufficient documentation

## 2013-11-11 DIAGNOSIS — H1130 Conjunctival hemorrhage, unspecified eye: Secondary | ICD-10-CM | POA: Insufficient documentation

## 2013-11-11 DIAGNOSIS — I959 Hypotension, unspecified: Secondary | ICD-10-CM | POA: Insufficient documentation

## 2013-11-11 DIAGNOSIS — S0181XA Laceration without foreign body of other part of head, initial encounter: Secondary | ICD-10-CM | POA: Diagnosis present

## 2013-11-11 DIAGNOSIS — M1990 Unspecified osteoarthritis, unspecified site: Secondary | ICD-10-CM | POA: Insufficient documentation

## 2013-11-11 DIAGNOSIS — IMO0002 Reserved for concepts with insufficient information to code with codable children: Secondary | ICD-10-CM | POA: Insufficient documentation

## 2013-11-11 DIAGNOSIS — K649 Unspecified hemorrhoids: Secondary | ICD-10-CM | POA: Insufficient documentation

## 2013-11-11 DIAGNOSIS — E785 Hyperlipidemia, unspecified: Secondary | ICD-10-CM | POA: Insufficient documentation

## 2013-11-11 HISTORY — DX: Migraine, unspecified, not intractable, without status migrainosus: G43.909

## 2013-11-11 LAB — CK TOTAL AND CKMB (NOT AT ARMC)
CK, MB: 1.2 ng/mL (ref 0.3–4.0)
Relative Index: INVALID (ref 0.0–2.5)
Total CK: 83 U/L (ref 7–232)

## 2013-11-11 LAB — CBC WITH DIFFERENTIAL/PLATELET
Basophils Absolute: 0 10*3/uL (ref 0.0–0.1)
Basophils Relative: 0 % (ref 0–1)
Eosinophils Absolute: 0.2 10*3/uL (ref 0.0–0.7)
Eosinophils Relative: 2 % (ref 0–5)
HEMATOCRIT: 43.8 % (ref 39.0–52.0)
Hemoglobin: 15.2 g/dL (ref 13.0–17.0)
LYMPHS PCT: 25 % (ref 12–46)
Lymphs Abs: 2.3 10*3/uL (ref 0.7–4.0)
MCH: 34.1 pg — AB (ref 26.0–34.0)
MCHC: 34.7 g/dL (ref 30.0–36.0)
MCV: 98.2 fL (ref 78.0–100.0)
MONO ABS: 0.6 10*3/uL (ref 0.1–1.0)
Monocytes Relative: 6 % (ref 3–12)
Neutro Abs: 6.2 10*3/uL (ref 1.7–7.7)
Neutrophils Relative %: 67 % (ref 43–77)
PLATELETS: 254 10*3/uL (ref 150–400)
RBC: 4.46 MIL/uL (ref 4.22–5.81)
RDW: 12.3 % (ref 11.5–15.5)
WBC: 9.2 10*3/uL (ref 4.0–10.5)

## 2013-11-11 LAB — TYPE AND SCREEN
ABO/RH(D): A POS
ANTIBODY SCREEN: NEGATIVE
UNIT DIVISION: 0
Unit division: 0

## 2013-11-11 LAB — I-STAT CHEM 8, ED
BUN: 10 mg/dL (ref 6–23)
CHLORIDE: 98 meq/L (ref 96–112)
CREATININE: 1.2 mg/dL (ref 0.50–1.35)
Calcium, Ion: 1.21 mmol/L (ref 1.12–1.23)
Glucose, Bld: 136 mg/dL — ABNORMAL HIGH (ref 70–99)
HEMATOCRIT: 46 % (ref 39.0–52.0)
Hemoglobin: 15.6 g/dL (ref 13.0–17.0)
POTASSIUM: 4 meq/L (ref 3.7–5.3)
Sodium: 137 mEq/L (ref 137–147)
TCO2: 22 mmol/L (ref 0–100)

## 2013-11-11 LAB — CDS SEROLOGY

## 2013-11-11 LAB — CBC
HEMATOCRIT: 33 % — AB (ref 39.0–52.0)
HEMOGLOBIN: 11.7 g/dL — AB (ref 13.0–17.0)
MCH: 34 pg (ref 26.0–34.0)
MCHC: 35.5 g/dL (ref 30.0–36.0)
MCV: 95.9 fL (ref 78.0–100.0)
Platelets: 220 10*3/uL (ref 150–400)
RBC: 3.44 MIL/uL — AB (ref 4.22–5.81)
RDW: 12.4 % (ref 11.5–15.5)
WBC: 18.5 10*3/uL — ABNORMAL HIGH (ref 4.0–10.5)

## 2013-11-11 LAB — PREPARE FRESH FROZEN PLASMA
Unit division: 0
Unit division: 0

## 2013-11-11 LAB — COMPREHENSIVE METABOLIC PANEL
ALT: 61 U/L — AB (ref 0–53)
AST: 35 U/L (ref 0–37)
Albumin: 3.7 g/dL (ref 3.5–5.2)
Alkaline Phosphatase: 46 U/L (ref 39–117)
BUN: 11 mg/dL (ref 6–23)
CALCIUM: 9.6 mg/dL (ref 8.4–10.5)
CO2: 22 meq/L (ref 19–32)
CREATININE: 1.03 mg/dL (ref 0.50–1.35)
Chloride: 95 mEq/L — ABNORMAL LOW (ref 96–112)
GFR, EST NON AFRICAN AMERICAN: 84 mL/min — AB (ref >90)
GLUCOSE: 136 mg/dL — AB (ref 70–99)
Potassium: 4.1 mEq/L (ref 3.7–5.3)
Sodium: 134 mEq/L — ABNORMAL LOW (ref 137–147)
TOTAL PROTEIN: 6.5 g/dL (ref 6.0–8.3)
Total Bilirubin: 0.5 mg/dL (ref 0.3–1.2)

## 2013-11-11 LAB — URINALYSIS, ROUTINE W REFLEX MICROSCOPIC
BILIRUBIN URINE: NEGATIVE
Glucose, UA: NEGATIVE mg/dL
HGB URINE DIPSTICK: NEGATIVE
Ketones, ur: NEGATIVE mg/dL
Leukocytes, UA: NEGATIVE
NITRITE: NEGATIVE
PH: 7 (ref 5.0–8.0)
Protein, ur: NEGATIVE mg/dL
Specific Gravity, Urine: 1.016 (ref 1.005–1.030)
Urobilinogen, UA: 0.2 mg/dL (ref 0.0–1.0)

## 2013-11-11 LAB — PROTIME-INR
INR: 1.06 (ref 0.00–1.49)
PROTHROMBIN TIME: 13.6 s (ref 11.6–15.2)

## 2013-11-11 LAB — ETHANOL

## 2013-11-11 LAB — ABO/RH: ABO/RH(D): A POS

## 2013-11-11 MED ORDER — BACITRACIN ZINC 500 UNIT/GM EX OINT
TOPICAL_OINTMENT | Freq: Two times a day (BID) | CUTANEOUS | Status: DC
  Filled 2013-11-11: qty 28.35

## 2013-11-11 MED ORDER — NAPROXEN 500 MG PO TABS
500.0000 mg | ORAL_TABLET | Freq: Two times a day (BID) | ORAL | Status: DC
  Administered 2013-11-11 – 2013-11-12 (×2): 500 mg via ORAL
  Filled 2013-11-11 (×5): qty 1

## 2013-11-11 MED ORDER — ATORVASTATIN CALCIUM 20 MG PO TABS
20.0000 mg | ORAL_TABLET | Freq: Every day | ORAL | Status: DC
  Administered 2013-11-12: 20 mg via ORAL
  Filled 2013-11-11: qty 1

## 2013-11-11 MED ORDER — TETANUS-DIPHTH-ACELL PERTUSSIS 5-2.5-18.5 LF-MCG/0.5 IM SUSP
0.5000 mL | Freq: Once | INTRAMUSCULAR | Status: AC
  Administered 2013-11-11: 0.5 mL via INTRAMUSCULAR
  Filled 2013-11-11: qty 0.5

## 2013-11-11 MED ORDER — ONDANSETRON HCL 4 MG PO TABS: 4.0000 mg | ORAL_TABLET | Freq: Four times a day (QID) | ORAL | Status: DC | PRN

## 2013-11-11 MED ORDER — CEPHALEXIN 500 MG PO CAPS: 500.0000 mg | ORAL_CAPSULE | Freq: Four times a day (QID) | ORAL | Status: DC

## 2013-11-11 MED ORDER — HYDROCODONE-ACETAMINOPHEN 5-325 MG PO TABS
1.0000 | ORAL_TABLET | Freq: Once | ORAL | Status: AC
  Administered 2013-11-11: 1 via ORAL
  Filled 2013-11-11: qty 1

## 2013-11-11 MED ORDER — HYDROCODONE-ACETAMINOPHEN 7.5-325 MG/15ML PO SOLN: 10.0000 mL | Freq: Once | ORAL | Status: DC

## 2013-11-11 MED ORDER — TRAMADOL HCL 50 MG PO TABS
50.0000 mg | ORAL_TABLET | Freq: Four times a day (QID) | ORAL | Status: DC | PRN
  Administered 2013-11-11 – 2013-11-12 (×2): 100 mg via ORAL
  Filled 2013-11-11 (×2): qty 2

## 2013-11-11 MED ORDER — SODIUM CHLORIDE 0.9 % IV BOLUS (SEPSIS)
1000.0000 mL | Freq: Once | INTRAVENOUS | Status: AC
  Administered 2013-11-11: 1000 mL via INTRAVENOUS

## 2013-11-11 MED ORDER — ENOXAPARIN SODIUM 40 MG/0.4ML ~~LOC~~ SOLN
40.0000 mg | SUBCUTANEOUS | Status: DC
Start: 2013-11-11 — End: 2013-11-12
  Filled 2013-11-11: qty 0.4

## 2013-11-11 MED ORDER — OXYCODONE-ACETAMINOPHEN 5-325 MG PO TABS: 1.0000 | ORAL_TABLET | Freq: Four times a day (QID) | ORAL | Status: DC | PRN

## 2013-11-11 MED ORDER — ONDANSETRON HCL 4 MG/2ML IJ SOLN: 4.0000 mg | Freq: Four times a day (QID) | INTRAMUSCULAR | Status: DC | PRN

## 2013-11-11 MED ORDER — BACITRACIN ZINC 500 UNIT/GM EX OINT
TOPICAL_OINTMENT | Freq: Two times a day (BID) | CUTANEOUS | Status: DC
  Administered 2013-11-11: 1 via TOPICAL
  Administered 2013-11-12: 15.5556 via TOPICAL
  Filled 2013-11-11 (×2): qty 15

## 2013-11-11 MED ORDER — MORPHINE SULFATE 2 MG/ML IJ SOLN: 2.0000 mg | INTRAMUSCULAR | Status: DC | PRN

## 2013-11-11 MED ORDER — SODIUM CHLORIDE 0.45 % IV SOLN
INTRAVENOUS | Status: DC
  Administered 2013-11-11: 18:00:00 via INTRAVENOUS
  Filled 2013-11-11 (×3): qty 1000

## 2013-11-11 NOTE — ED Notes (Signed)
Large laceration to right scalp. Dr. Dina Rich applying pressure at site. Denies use of blood thinner and blood pressure meds

## 2013-11-11 NOTE — ED Notes (Signed)
Pt offered pain medicine but refused it.

## 2013-11-11 NOTE — ED Notes (Signed)
Pt transported to [facility] with Lexine Baton, Therapist, sports

## 2013-11-11 NOTE — Progress Notes (Signed)
Orthopedic Tech Progress Note Patient Details:  Joshua Manning [birth year] [id number]  Patient ID: [name], male   DOB: 05-Oct-1965, 48 y.o.   MRN: [id number]   [name] 11/11/2013, 11:04 [facility] 2 trauma
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
## 2013-11-11 NOTE — ED Notes (Signed)
Good peripheral pulses.

## 2013-11-11 NOTE — ED Notes (Signed)
Report to [facility]. Pt ready for transport

## 2013-11-11 NOTE — ED Notes (Signed)
Family at beside. Family given emotional support. 

## 2013-11-11 NOTE — ED Notes (Signed)
Vital signs stable. 

## 2013-11-11 NOTE — ED Notes (Signed)
Dr Dina Rich injection lidocaine with epi
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
## 2013-11-11 NOTE — ED Notes (Addendum)
Portable xray at bedside.

## 2013-11-11 NOTE — ED Notes (Signed)
Attempt to DC pt. Pt pale, clammy, decrease in bp. MD informed. Restart IV place in Trendelenburg fluid bolus. Marland Kitchen

## 2013-11-11 NOTE — H&P (Signed)
Patient examined and I agree with the assessment and plan  Georganna Skeans, MD, MPH, FACS Trauma: [phone] General Surgery: [phone]  11/11/2013 4:49 PM
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
## 2013-11-11 NOTE — Consult Note (Signed)
I D/W Dr. Benson Norway at the bedside. Patient examined and I agree with the assessment and plan  Georganna Skeans, MD, MPH, FACS Trauma: [phone] General Surgery: [phone]  11/11/2013 1:36 PM

## 2013-11-11 NOTE — ED Notes (Signed)
Pt denies complaints. md aware of bp ns bolus infusing
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
## 2013-11-11 NOTE — ED Notes (Signed)
Pt ambulates unassisted and without complaint.
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
## 2013-11-11 NOTE — ED Notes (Signed)
Dr. Dina Rich thinks the bleed is arterial. Attempting to suture it shut.
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
## 2013-11-11 NOTE — ED Notes (Signed)
Pt returned from CT. VSS.

## 2013-11-12 DIAGNOSIS — D62 Acute posthemorrhagic anemia: Secondary | ICD-10-CM | POA: Diagnosis not present

## 2013-11-12 DIAGNOSIS — S0180XA Unspecified open wound of other part of head, initial encounter: Secondary | ICD-10-CM

## 2013-11-12 DIAGNOSIS — R42 Dizziness and giddiness: Secondary | ICD-10-CM

## 2013-11-12 DIAGNOSIS — I959 Hypotension, unspecified: Secondary | ICD-10-CM | POA: Diagnosis present

## 2013-11-12 DIAGNOSIS — S0085XA Superficial foreign body of other part of head, initial encounter: Secondary | ICD-10-CM | POA: Diagnosis present

## 2013-11-12 LAB — CBC
HCT: 28.8 % — ABNORMAL LOW (ref 39.0–52.0)
Hemoglobin: 9.9 g/dL — ABNORMAL LOW (ref 13.0–17.0)
MCH: 33.8 pg (ref 26.0–34.0)
MCHC: 34.4 g/dL (ref 30.0–36.0)
MCV: 98.3 fL (ref 78.0–100.0)
Platelets: 192 10*3/uL (ref 150–400)
RBC: 2.93 MIL/uL — ABNORMAL LOW (ref 4.22–5.81)
RDW: 12.4 % (ref 11.5–15.5)
WBC: 12.1 10*3/uL — AB (ref 4.0–10.5)

## 2013-11-12 MED ORDER — NAPROXEN 500 MG PO TABS: 500.0000 mg | ORAL_TABLET | Freq: Two times a day (BID) | ORAL | Status: DC

## 2013-11-12 MED ORDER — CEPHALEXIN 500 MG PO CAPS: 500.0000 mg | ORAL_CAPSULE | Freq: Four times a day (QID) | ORAL | Status: DC

## 2013-11-12 MED ORDER — TRAMADOL HCL 50 MG PO TABS: 50.0000 mg | ORAL_TABLET | Freq: Four times a day (QID) | ORAL | Status: DC | PRN

## 2013-11-12 NOTE — Progress Notes (Signed)
Physical Therapy Discharge Patient Details Name: Joshua Manning MRN: [id number] DOB: [birth year] Today's Date: 11/12/2013 Time:  -     Patient discharged from PT services secondary to Pt is independent and has his D/C order.  Will sign off.Cletis Athens Ceria Suminski 11/12/2013, 9:39 AM

## 2013-11-12 NOTE — Discharge Summary (Signed)
Patient looks fine.  Okay to go home.  This patient has been seen and I agree with the findings and treatment plan.  Kathryne Eriksson. Dahlia Bailiff, MD, [place] [phone] (pager) [phone] (direct pager) Trauma Surgeon
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
## 2013-11-12 NOTE — Progress Notes (Signed)
Pt and pt's girlfriend present for discharge instructions at the bedside. Pt was given handout along with review of medications and Rx's given. Pt was given remaining ointment to take home as well. Pt reports that he is feeling fine and is ready to go. Follow up appointment importance reviewed.

## 2013-11-12 NOTE — Progress Notes (Signed)
Spoke with the patient about taking iron and vitamin to help with acute blood loss anemia.  This patient has been seen and I agree with the findings and treatment plan.  Kathryne Eriksson. Dahlia Bailiff, MD, [place] [phone] (pager) [phone] (direct pager) Trauma Surgeon
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
## 2013-11-18 ENCOUNTER — Ambulatory Visit (INDEPENDENT_AMBULATORY_CARE_PROVIDER_SITE_OTHER): Payer: No Typology Code available for payment source | Admitting: Family Medicine

## 2013-11-18 ENCOUNTER — Other Ambulatory Visit: Payer: Self-pay | Admitting: Internal Medicine

## 2013-11-18 ENCOUNTER — Encounter: Payer: Self-pay | Admitting: Family Medicine

## 2013-11-18 VITALS — BP 112/74 | HR 89 | Temp 98.5°F | Ht 70.0 in | Wt 178.0 lb

## 2013-11-18 DIAGNOSIS — S0181XA Laceration without foreign body of other part of head, initial encounter: Secondary | ICD-10-CM

## 2013-11-18 DIAGNOSIS — S0085XA Superficial foreign body of other part of head, initial encounter: Secondary | ICD-10-CM

## 2013-11-18 DIAGNOSIS — S0005XA Superficial foreign body of scalp, initial encounter: Secondary | ICD-10-CM

## 2013-11-18 DIAGNOSIS — S1095XA Superficial foreign body of unspecified part of neck, initial encounter: Secondary | ICD-10-CM

## 2013-11-18 DIAGNOSIS — S0180XA Unspecified open wound of other part of head, initial encounter: Secondary | ICD-10-CM
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
## 2013-11-18 NOTE — Progress Notes (Signed)
Pre visit review using our clinic review tool, if applicable. No additional management support is needed unless otherwise documented below in the visit note. 

## 2013-12-19 ENCOUNTER — Other Ambulatory Visit: Payer: Self-pay | Admitting: Internal Medicine

## 2014-02-23 ENCOUNTER — Telehealth: Payer: Self-pay | Admitting: Internal Medicine
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
## 2014-03-04 ENCOUNTER — Encounter: Payer: Self-pay | Admitting: Family Medicine

## 2014-03-04 ENCOUNTER — Ambulatory Visit (INDEPENDENT_AMBULATORY_CARE_PROVIDER_SITE_OTHER): Payer: No Typology Code available for payment source | Admitting: Family Medicine

## 2014-03-04 VITALS — BP 125/74 | HR 84 | Temp 99.4°F | Ht 70.0 in | Wt 176.0 lb

## 2014-03-04 DIAGNOSIS — J209 Acute bronchitis, unspecified: Secondary | ICD-10-CM

## 2014-03-04 MED ORDER — METHYLPREDNISOLONE 4 MG PO KIT: PACK | ORAL | Status: AC

## 2014-03-04 MED ORDER — ALBUTEROL SULFATE HFA 108 (90 BASE) MCG/ACT IN AERS: 2.0000 | INHALATION_SPRAY | RESPIRATORY_TRACT | Status: DC | PRN

## 2014-03-04 MED ORDER — AMOXICILLIN-POT CLAVULANATE 875-125 MG PO TABS: 1.0000 | ORAL_TABLET | Freq: Two times a day (BID) | ORAL | Status: DC

## 2014-03-04 NOTE — Progress Notes (Signed)
Pre visit review using our clinic review tool, if applicable. No additional management support is needed unless otherwise documented below in the visit note. 

## 2014-03-04 NOTE — Progress Notes (Signed)
   Subjective:    Patient ID: Joshua Manning, male    DOB: [birth year], 48 y.o.   MRN: [id number]  HPI Here for 2 and 1/2 weeks of chest congestion and coughing up yellow sputum. No fever. Using Mucinex    Review of Systems  Constitutional: Negative.   HENT: Positive for congestion and postnasal drip. Negative for sinus pressure.   Eyes: Negative.   Respiratory: Positive for cough.        Objective:   Physical Exam  Constitutional: He appears well-developed and well-nourished.  HENT:  Right Ear: External ear normal.  Left Ear: External ear normal.  Nose: Nose normal.  Mouth/Throat: Oropharynx is clear and moist.  Eyes: Conjunctivae are normal.  Pulmonary/Chest: Effort normal. No respiratory distress. He has no wheezes. He has no rales.  Scattered rhonchi   Lymphadenopathy:    He has no cervical adenopathy.          Assessment & Plan:  Given Augmentin, a steroid taper and an inhaler.

## 2014-04-18 ENCOUNTER — Other Ambulatory Visit: Payer: Self-pay | Admitting: Internal Medicine

## 2014-08-10 ENCOUNTER — Other Ambulatory Visit: Payer: Self-pay | Admitting: *Deleted

## 2014-08-10 MED ORDER — TADALAFIL 5 MG PO TABS: 5.0000 mg | ORAL_TABLET | Freq: Every day | ORAL | Status: DC | PRN

## 2014-08-19 ENCOUNTER — Encounter: Payer: Self-pay | Admitting: Family Medicine

## 2014-08-19 ENCOUNTER — Ambulatory Visit (INDEPENDENT_AMBULATORY_CARE_PROVIDER_SITE_OTHER): Payer: BLUE CROSS/BLUE SHIELD | Admitting: Family Medicine

## 2014-08-19 VITALS — BP 120/64 | Temp 99.0°F | Wt 174.0 lb

## 2014-08-19 DIAGNOSIS — Z720 Tobacco use: Secondary | ICD-10-CM

## 2014-08-19 DIAGNOSIS — F17200 Nicotine dependence, unspecified, uncomplicated: Secondary | ICD-10-CM

## 2014-08-19 DIAGNOSIS — N529 Male erectile dysfunction, unspecified: Secondary | ICD-10-CM | POA: Insufficient documentation

## 2014-08-19 DIAGNOSIS — E785 Hyperlipidemia, unspecified: Secondary | ICD-10-CM

## 2014-08-19 NOTE — Patient Instructions (Addendum)
See me back after your birthday for physical (probably best to schedule today)  Bloodwork 1 week before including PSA unless you opt out of it  Your cholesterol was high on last check, if it remains high we may increase your dose of medicine.   Strongly encourage you to quit smoking. At age 49, we will discuss lung cancer screening.

## 2014-08-19 NOTE — Assessment & Plan Note (Signed)
Strongly advised cessation. Patient declines but is contemplating quitting on his own.
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
## 2014-08-19 NOTE — Assessment & Plan Note (Signed)
Discussed last LDL near 150 on atorvastatin 150mg . Patient wants to monitor at his physical before making changes. Plan for 40mg  if still LDL >100.

## 2014-08-21 ENCOUNTER — Telehealth: Payer: Self-pay | Admitting: Family Medicine

## 2014-08-21 NOTE — Telephone Encounter (Signed)
Yes may fill. Thanks for asking

## 2014-08-21 NOTE — Telephone Encounter (Signed)
Pt called [facility] and they said they do not have the rx  tadalafil (CIALIS) 5 MG tablet  Can we please check on this

## 2014-08-21 NOTE — Telephone Encounter (Signed)
Is this ok to refill? I did not see anywhere in the note from 08/19/14 where you mentioned refill.

## 2014-08-24 MED ORDER — TADALAFIL 5 MG PO TABS: 5.0000 mg | ORAL_TABLET | Freq: Every day | ORAL | Status: DC | PRN

## 2014-08-24 NOTE — Telephone Encounter (Signed)
Medication refilled

## 2014-10-02 ENCOUNTER — Other Ambulatory Visit: Payer: Self-pay | Admitting: Family Medicine

## 2014-10-19 ENCOUNTER — Other Ambulatory Visit: Payer: Self-pay

## 2014-10-19 MED ORDER — ATORVASTATIN CALCIUM 20 MG PO TABS: 20.0000 mg | ORAL_TABLET | Freq: Every day | ORAL | Status: DC

## 2014-10-19 NOTE — Telephone Encounter (Signed)
[facility] refill request for ATORVASTATIN 20MG 

## 2014-11-28 ENCOUNTER — Other Ambulatory Visit: Payer: Self-pay | Admitting: Family Medicine

## 2014-12-01 ENCOUNTER — Other Ambulatory Visit: Payer: Self-pay | Admitting: *Deleted

## 2015-03-10 ENCOUNTER — Other Ambulatory Visit (INDEPENDENT_AMBULATORY_CARE_PROVIDER_SITE_OTHER): Payer: 59

## 2015-03-10 DIAGNOSIS — Z0000 Encounter for general adult medical examination without abnormal findings: Secondary | ICD-10-CM | POA: Diagnosis not present

## 2015-03-10 LAB — BASIC METABOLIC PANEL
BUN: 6 mg/dL (ref 6–23)
CALCIUM: 9.9 mg/dL (ref 8.4–10.5)
CO2: 28 mEq/L (ref 19–32)
CREATININE: 0.69 mg/dL (ref 0.40–1.50)
Chloride: 97 mEq/L (ref 96–112)
GFR: 128.99 mL/min (ref >60.00)
Glucose, Bld: 82 mg/dL (ref 70–99)
Potassium: 4.4 mEq/L (ref 3.5–5.1)
Sodium: 134 mEq/L — ABNORMAL LOW (ref 135–145)

## 2015-03-10 LAB — POCT URINALYSIS DIPSTICK
Bilirubin, UA: NEGATIVE
Blood, UA: NEGATIVE
GLUCOSE UA: NEGATIVE
Ketones, UA: NEGATIVE
Leukocytes, UA: NEGATIVE
NITRITE UA: NEGATIVE
Protein, UA: NEGATIVE
SPEC GRAV UA: 1.015
UROBILINOGEN UA: 0.2
pH, UA: 7.5

## 2015-03-10 LAB — HEPATIC FUNCTION PANEL
ALBUMIN: 4.6 g/dL (ref 3.5–5.2)
ALK PHOS: 52 U/L (ref 39–117)
ALT: 39 U/L (ref 0–53)
AST: 24 U/L (ref 0–37)
Bilirubin, Direct: 0.2 mg/dL (ref 0.0–0.3)
Total Bilirubin: 0.7 mg/dL (ref 0.2–1.2)
Total Protein: 7.1 g/dL (ref 6.0–8.3)

## 2015-03-10 LAB — CBC WITH DIFFERENTIAL/PLATELET
BASOS ABS: 0 10*3/uL (ref 0.0–0.1)
Basophils Relative: 0.5 % (ref 0.0–3.0)
EOS ABS: 0.3 10*3/uL (ref 0.0–0.7)
Eosinophils Relative: 3.2 % (ref 0.0–5.0)
HEMATOCRIT: 46.3 % (ref 39.0–52.0)
HEMOGLOBIN: 16.1 g/dL (ref 13.0–17.0)
Lymphocytes Relative: 17.7 % (ref 12.0–46.0)
Lymphs Abs: 1.7 10*3/uL (ref 0.7–4.0)
MCHC: 34.7 g/dL (ref 30.0–36.0)
MCV: 99 fl (ref 78.0–100.0)
Monocytes Absolute: 0.7 10*3/uL (ref 0.1–1.0)
Monocytes Relative: 7.5 % (ref 3.0–12.0)
Neutro Abs: 6.9 10*3/uL (ref 1.4–7.7)
Neutrophils Relative %: 71.1 % (ref 43.0–77.0)
Platelets: 297 10*3/uL (ref 150.0–400.0)
RBC: 4.68 Mil/uL (ref 4.22–5.81)
RDW: 13 % (ref 11.5–15.5)
WBC: 9.7 10*3/uL (ref 4.0–10.5)

## 2015-03-10 LAB — LIPID PANEL
CHOLESTEROL: 156 mg/dL (ref 0–200)
HDL: 50.6 mg/dL (ref >39.00)
LDL CALC: 78 mg/dL (ref 0–99)
NonHDL: 105.71
TRIGLYCERIDES: 141 mg/dL (ref 0.0–149.0)
Total CHOL/HDL Ratio: 3
VLDL: 28.2 mg/dL (ref 0.0–40.0)

## 2015-03-10 LAB — TSH: TSH: 1.47 u[IU]/mL (ref 0.35–4.50)

## 2015-03-10 LAB — PSA: PSA: 2.87 ng/mL (ref 0.10–4.00)

## 2015-03-16 ENCOUNTER — Encounter: Payer: Self-pay | Admitting: Gastroenterology

## 2015-03-16 ENCOUNTER — Encounter: Payer: Self-pay | Admitting: Family Medicine

## 2015-03-16 ENCOUNTER — Ambulatory Visit (INDEPENDENT_AMBULATORY_CARE_PROVIDER_SITE_OTHER): Payer: 59 | Admitting: Family Medicine

## 2015-03-16 VITALS — BP 130/82 | HR 84 | Temp 99.1°F | Ht 70.0 in | Wt 176.0 lb

## 2015-03-16 DIAGNOSIS — Z1211 Encounter for screening for malignant neoplasm of colon: Secondary | ICD-10-CM

## 2015-03-16 DIAGNOSIS — R0602 Shortness of breath: Secondary | ICD-10-CM | POA: Diagnosis not present

## 2015-03-16 DIAGNOSIS — N529 Male erectile dysfunction, unspecified: Secondary | ICD-10-CM

## 2015-03-16 DIAGNOSIS — Z0000 Encounter for general adult medical examination without abnormal findings: Secondary | ICD-10-CM | POA: Diagnosis not present

## 2015-03-16 MED ORDER — TADALAFIL 10 MG PO TABS: 10.0000 mg | ORAL_TABLET | Freq: Every day | ORAL | Status: DC | PRN

## 2015-03-16 MED ORDER — VARENICLINE TARTRATE 0.5 MG X 11 & 1 MG X 42 PO MISC: ORAL | Status: DC

## 2015-03-16 NOTE — Patient Instructions (Addendum)
[facility] will call you to schedule colonoscopy.  No changes to meds except increase cialis strength  Call me in 3 weeks after starting chantix, if you have successfully quit, we will do continuing monthly pack  Happy to check in 6 months to see how things are going with cholesterol and smoking or sooner if you need [place], physical in 1 year
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
## 2015-04-16 ENCOUNTER — Telehealth: Payer: Self-pay | Admitting: Family Medicine

## 2015-04-16 MED ORDER — SILDENAFIL CITRATE 100 MG PO TABS: 50.0000 mg | ORAL_TABLET | Freq: Every day | ORAL | Status: DC | PRN

## 2015-04-16 NOTE — Telephone Encounter (Signed)
I am sorry to hear that. He has failed other options. Some people have had success with hypnosis but it is usually out of pocket. He can go back to trying nicotine replacement. 21mg  patch combined with 2mg  gum as needed (10 pieces max a day) works for some people

## 2015-04-16 NOTE — Telephone Encounter (Signed)
Trial viagra

## 2015-04-16 NOTE — Telephone Encounter (Signed)
Patient informed of the message below and states he wanted to let Dr Yong Channel know his insurance will not cover Cialis and he requested a different medication be called in.  Patient aware message will be addressed on Monday.

## 2015-04-16 NOTE — Telephone Encounter (Signed)
i sent this in

## 2015-04-16 NOTE — Telephone Encounter (Signed)
Patient informed. 

## 2015-04-16 NOTE — Telephone Encounter (Signed)
Pt states Chantix isn't working and he's almost completed the rx, wants to know what to do from here.

## 2015-05-12 ENCOUNTER — Ambulatory Visit (AMBULATORY_SURGERY_CENTER): Payer: Self-pay

## 2015-05-12 VITALS — Ht 69.0 in | Wt 180.0 lb

## 2015-05-12 DIAGNOSIS — Z860100 Personal history of colon polyps, unspecified: Secondary | ICD-10-CM

## 2015-05-12 MED ORDER — SUPREP BOWEL PREP KIT 17.5-3.13-1.6 GM/177ML PO SOLN: 1.0000 | Freq: Once | ORAL | Status: DC

## 2015-05-12 NOTE — Progress Notes (Signed)
No allergies to eggs or soy No diet/weight loss meds No past problems with anesthesia No home oxygen  Has internet; refused emmi

## 2015-05-20 ENCOUNTER — Encounter: Payer: Self-pay | Admitting: Gastroenterology

## 2015-05-26 ENCOUNTER — Ambulatory Visit (AMBULATORY_SURGERY_CENTER): Payer: Managed Care, Other (non HMO) | Admitting: Gastroenterology

## 2015-05-26 ENCOUNTER — Encounter: Payer: Self-pay | Admitting: Gastroenterology

## 2015-05-26 VITALS — BP 132/85 | HR 86 | Temp 97.7°F | Resp 20 | Ht 69.0 in | Wt 180.0 lb

## 2015-05-26 DIAGNOSIS — K635 Polyp of colon: Secondary | ICD-10-CM | POA: Diagnosis not present

## 2015-05-26 DIAGNOSIS — Z1211 Encounter for screening for malignant neoplasm of colon: Secondary | ICD-10-CM | POA: Diagnosis not present

## 2015-05-26 DIAGNOSIS — D125 Benign neoplasm of sigmoid colon: Secondary | ICD-10-CM | POA: Diagnosis not present

## 2015-05-26 MED ORDER — SODIUM CHLORIDE 0.9 % IV SOLN: 500.0000 mL | INTRAVENOUS | Status: DC

## 2015-05-26 NOTE — Op Note (Signed)
[facility]  [address]. [place], [address]   COLONOSCOPY PROCEDURE REPORT  PATIENT: Joshua Manning, Joshua Manning  MR#: [id number] BIRTHDATE: 11-15-1965 , 49  yrs. old GENDER: male ENDOSCOPIST: Ladene Artist, MD, Marval Regal REFERRED BY:  Marin Olp, M.D. PROCEDURE DATE:  05/26/2015 PROCEDURE:   Colonoscopy, screening and Colonoscopy with snare polypectomy First Screening Colonoscopy - Avg.  risk and is 50 yrs.  old or older Yes.  Prior Negative Screening - Now for repeat screening. N/A  History of Adenoma - Now for follow-up colonoscopy & has been > or = to 3 yrs.  N/A  Polyps removed today? Yes ASA CLASS:   Class II INDICATIONS:Screening for colonic neoplasia and Colorectal Neoplasm Risk Assessment for this procedure is average risk. MEDICATIONS: Monitored anesthesia care and Propofol 200 mg IV DESCRIPTION OF PROCEDURE:   After the risks benefits and alternatives of the procedure were thoroughly explained, informed consent was obtained.  The digital rectal exam revealed no abnormalities of the rectum.   The [id number]  endoscope was introduced through the anus and advanced to the cecum, which was identified by both the appendix and ileocecal valve. No adverse events experienced.   The quality of the prep was excellent. (Suprep was used)  The instrument was then slowly withdrawn as the colon was fully examined. Estimated blood loss is zero unless otherwise noted in this procedure report.    COLON FINDINGS: A sessile polyp measuring 6 mm in size was found in the sigmoid colon.  A polypectomy was performed with a cold snare. The resection was complete, the polyp tissue was completely retrieved and sent to histology.   The colonic mucosa appeared normal at the splenic flexure, in the transverse colon, rectum, descending colon, at the ileocecal valve, cecum, hepatic flexure, and in the ascending colon.  Retroflexed views revealed internal Grade I hemorrhoids.  The time to cecum = 2.6 Withdrawal time = 10.6 The scope was withdrawn and the procedure completed. COMPLICATIONS: There were no immediate complications.  ENDOSCOPIC IMPRESSION: 1.   Sessile polyp in the sigmoid colon; polypectomy performed with a cold snare 2.   Grade I internal hemorrhoids  RECOMMENDATIONS: 1.  Await pathology results 2.  Repeat colonoscopy in 5 years if polyp adenomatous; otherwise 10 years  eSigned:  Ladene Artist, MD, [facility] 05/26/2015 10:00 AM

## 2015-05-26 NOTE — Progress Notes (Signed)
Report to PACU, RN, vss, BBS= Clear.  

## 2015-05-26 NOTE — Patient Instructions (Signed)
Colon polyp x 1 removed today, and hemorrhoids seen. Handouts given VZ:CHYIFO,[id number]. Resume current medications. Call us with any questions or concerns. Thank you!  YOU HAD AN ENDOSCOPIC PROCEDURE TODAY AT [facility]:   Refer to the procedure report that was given to you for any specific questions about what was found during the examination.  If the procedure report does not answer your questions, please call your gastroenterologist to clarify.  If you requested that your care partner not be given the details of your procedure findings, then the procedure report has been included in a sealed envelope for you to review at your convenience later.  YOU SHOULD EXPECT: Some feelings of bloating in the abdomen. Passage of more gas than usual.  Walking can help get rid of the air that was put into your GI tract during the procedure and reduce the bloating. If you had a lower endoscopy (such as a colonoscopy or flexible sigmoidoscopy) you may notice spotting of blood in your stool or on the toilet paper. If you underwent a bowel prep for your procedure, you may not have a normal bowel movement for a few days.  Please Note:  You might notice some irritation and congestion in your nose or some drainage.  This is from the oxygen used during your procedure.  There is no need for concern and it should clear up in a day or so.  SYMPTOMS TO REPORT IMMEDIATELY:   Following lower endoscopy (colonoscopy or flexible sigmoidoscopy):  Excessive amounts of blood in the stool  Significant tenderness or worsening of abdominal pains  Swelling of the abdomen that is new, acute  Fever of 100F or higher   For urgent or emergent issues, a gastroenterologist can be reached at any hour by calling [phone].   DIET: Your first meal following the procedure should be a small meal and then it is ok to progress to your normal diet. Heavy or fried foods are harder to digest and may make you feel  nauseous or bloated.  Likewise, meals heavy in dairy and vegetables can increase bloating.  Drink plenty of fluids but you should avoid alcoholic beverages for 24 hours.  ACTIVITY:  You should plan to take it easy for the rest of today and you should NOT DRIVE or use heavy machinery until tomorrow (because of the sedation medicines used during the test).    FOLLOW UP: Our staff will call the number listed on your records the next business day following your procedure to check on you and address any questions or concerns that you may have regarding the information given to you following your procedure. If we do not reach you, we will leave a message.  However, if you are feeling well and you are not experiencing any problems, there is no need to return our call.  We will assume that you have returned to your regular daily activities without incident.  If any biopsies were taken you will be contacted by phone or by letter within the next 1-3 weeks.  Please call us at [phone] if you have not heard about the biopsies in 3 weeks.    SIGNATURES/CONFIDENTIALITY: You and/or your care partner have signed paperwork which will be entered into your electronic medical record.  These signatures attest to the fact that that the information above on your After Visit Summary has been reviewed and is understood.  Full responsibility of the confidentiality of this discharge information lies with you and/or your care-partner.

## 2015-05-26 NOTE — Progress Notes (Signed)
Called to room to assist during endoscopic procedure.  Patient ID and intended procedure confirmed with present staff. Received instructions for my participation in the procedure from the performing physician.  

## 2015-05-27 ENCOUNTER — Telehealth: Payer: Self-pay | Admitting: Emergency Medicine

## 2015-05-27 NOTE — Telephone Encounter (Signed)
Left message, identifier present 

## 2015-06-03 ENCOUNTER — Encounter: Payer: Self-pay | Admitting: Gastroenterology

## 2015-07-07 ENCOUNTER — Other Ambulatory Visit: Payer: Self-pay | Admitting: Family Medicine

## 2015-07-07 NOTE — Telephone Encounter (Signed)
Denied.  Pt bumped to 10 mg at last OV.  No longer taking 5 mg strength.

## 2015-08-19 ENCOUNTER — Ambulatory Visit (INDEPENDENT_AMBULATORY_CARE_PROVIDER_SITE_OTHER): Payer: Managed Care, Other (non HMO) | Admitting: Family Medicine

## 2015-08-19 ENCOUNTER — Encounter: Payer: Self-pay | Admitting: Family Medicine

## 2015-08-19 VITALS — BP 100/60 | HR 87 | Temp 98.9°F | Wt 185.0 lb

## 2015-08-19 DIAGNOSIS — A499 Bacterial infection, unspecified: Secondary | ICD-10-CM

## 2015-08-19 DIAGNOSIS — R208 Other disturbances of skin sensation: Secondary | ICD-10-CM | POA: Diagnosis not present

## 2015-08-19 DIAGNOSIS — R200 Anesthesia of skin: Secondary | ICD-10-CM

## 2015-08-19 DIAGNOSIS — B9689 Other specified bacterial agents as the cause of diseases classified elsewhere: Secondary | ICD-10-CM

## 2015-08-19 DIAGNOSIS — J329 Chronic sinusitis, unspecified: Secondary | ICD-10-CM | POA: Diagnosis not present

## 2015-08-19 MED ORDER — AMOXICILLIN-POT CLAVULANATE 875-125 MG PO TABS: 1.0000 | ORAL_TABLET | Freq: Two times a day (BID) | ORAL | Status: DC

## 2015-08-19 NOTE — Assessment & Plan Note (Addendum)
S: Patient states over last year has had bilateral arm numbness past his elbows primarily at night if his arms come up to chest level. His strength is normal but they just have a numbness and tingling too them and feel like they are asleep. 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 Patient states it is just getting very difficult to sleep as has to get up and shake arms out below his chest and it will resolve within a few minutes. Neck imaging had been considered ROS- no arm weakness O: patient has some reproduction of hand tingling after spurling test. 5/5 arm strenght. 2+ radial and ulnar pulse A/P: we will get cervical films at this time. Consider course of prednisone if arthritis noted. Ifnot, would advise follow up with his orthopedic group- usually sees Dr. Mayer Camel- with consideration of advanced imaging through ortho
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
## 2015-08-19 NOTE — Patient Instructions (Signed)
Go to [facility] to get x-rays. Consider steroid course to see if helps if shows arthritis. If not- return to Dr. Damita Dunnings office for further evaluation.   Sinsusitis Bacterial based on: Symptoms >10 days, double sickening. Has had sinus infections in the past but this is new acute issue.   Treatment: -other symptomatic care with mucinex and/or neti pot -Antibiotic indicated: yes  Finally, we reviewed reasons to return to care including if symptoms worsen or persist or new concerns arise.  Meds ordered this encounter  Medications  . amoxicillin-clavulanate (AUGMENTIN) 875-125 MG tablet    Sig: Take 1 tablet by mouth 2 (two) times daily.    Dispense:  14 tablet    Refill:  0

## 2015-09-02 ENCOUNTER — Ambulatory Visit (INDEPENDENT_AMBULATORY_CARE_PROVIDER_SITE_OTHER)
Admission: RE | Admit: 2015-09-02 | Discharge: 2015-09-02 | Disposition: A | Discharge status: Home / Self Care | Payer: Managed Care, Other (non HMO) | Source: Ambulatory Visit | Attending: Family Medicine | Admitting: Family Medicine

## 2015-09-02 DIAGNOSIS — R208 Other disturbances of skin sensation: Secondary | ICD-10-CM

## 2015-09-02 DIAGNOSIS — R200 Anesthesia of skin: Secondary | ICD-10-CM

## 2015-09-03 ENCOUNTER — Other Ambulatory Visit: Payer: Self-pay

## 2015-09-03 MED ORDER — PREDNISONE 10 MG PO TABS: ORAL_TABLET | ORAL | Status: DC

## 2015-10-22 ENCOUNTER — Other Ambulatory Visit: Payer: Self-pay | Admitting: Family Medicine

## 2015-11-09 ENCOUNTER — Telehealth: Payer: Self-pay | Admitting: Family Medicine

## 2015-11-09 NOTE — Telephone Encounter (Signed)
Noted  

## 2015-11-09 NOTE — Telephone Encounter (Signed)
Patient Name: Joshua Manning  DOB: [birth year]    Initial Comment Caller states he was seen a month ago for arms falling asleep, situation getting worse.   Nurse Assessment  Nurse: Raphael Gibney, RN, Vanita Ingles Date/Time Eilene Ghazi Time): 11/09/2015 2:12:39 PM  Confirm and document reason for call. If symptomatic, describe symptoms. You must click the next button to save text entered. ---Caller states he was seen about a month ago for his arms falling asleep. His arms are falling asleep in the day as well as at night. He completed the prednisone.  Has the patient traveled out of the country within the last 30 days? ---Not Applicable  Does the patient have any new or worsening symptoms? ---Yes  Will a triage be completed? ---Yes  Related visit to physician within the last 2 weeks? ---No  Does the PT have any chronic conditions? (i.e. diabetes, asthma, etc.) ---No  Is this a behavioral health or substance abuse call? ---No     Guidelines    Guideline Title Affirmed Question Affirmed Notes  Neurologic Deficit [1] Numbness or tingling in one or both hands AND [2] is a chronic symptom (recurrent or ongoing AND present > 4 weeks)    Final Disposition User   See PCP When Office is Open (within 3 days) Raphael Gibney, RN, Vanita Ingles    Comments  Appt scheduled for 11/11/15 at 11:30 am with Dr. Garret Reddish.   Referrals  REFERRED TO PCP OFFICE   Disagree/Comply: Comply

## 2015-11-09 NOTE — Telephone Encounter (Signed)
FYI: Appt scheduled for 11/11/15 at 11:30 am with Dr. Garret Reddish.

## 2015-11-11 ENCOUNTER — Ambulatory Visit (INDEPENDENT_AMBULATORY_CARE_PROVIDER_SITE_OTHER): Payer: Managed Care, Other (non HMO) | Admitting: Family Medicine

## 2015-11-11 ENCOUNTER — Other Ambulatory Visit: Payer: Self-pay

## 2015-11-11 ENCOUNTER — Encounter: Payer: Self-pay | Admitting: Family Medicine

## 2015-11-11 VITALS — BP 118/64 | HR 87 | Temp 98.4°F | Wt 179.0 lb

## 2015-11-11 DIAGNOSIS — R200 Anesthesia of skin: Secondary | ICD-10-CM

## 2015-11-11 DIAGNOSIS — N5201 Erectile dysfunction due to arterial insufficiency: Secondary | ICD-10-CM

## 2015-11-11 DIAGNOSIS — M47812 Spondylosis without myelopathy or radiculopathy, cervical region: Secondary | ICD-10-CM

## 2015-11-11 DIAGNOSIS — M4692 Unspecified inflammatory spondylopathy, cervical region: Secondary | ICD-10-CM

## 2015-11-11 DIAGNOSIS — R202 Paresthesia of skin: Secondary | ICD-10-CM | POA: Diagnosis not present

## 2015-11-11 LAB — TSH: TSH: 0.84 u[IU]/mL (ref 0.35–4.50)

## 2015-11-11 LAB — VITAMIN B12: VITAMIN B 12: 502 pg/mL (ref 211–911)

## 2015-11-11 LAB — HEMOGLOBIN A1C: Hgb A1c MFr Bld: 5.5 % (ref 4.6–6.5)

## 2015-11-11 MED ORDER — TADALAFIL 10 MG PO TABS: 5.0000 mg | ORAL_TABLET | Freq: Every day | ORAL | Status: DC | PRN

## 2015-11-11 MED ORDER — SILDENAFIL CITRATE 20 MG PO TABS: ORAL_TABLET | ORAL | Status: DC

## 2015-11-11 MED ORDER — TADALAFIL 10 MG PO TABS: 10.0000 mg | ORAL_TABLET | Freq: Every day | ORAL | Status: DC | PRN

## 2015-11-11 NOTE — Assessment & Plan Note (Signed)
Refilled cialis- may call in to trial sildenafil option with marley depending on cost

## 2015-11-11 NOTE — Patient Instructions (Signed)
Let's check some bloodwork to make sure no cause for numbness tingling found  We will call you within a week about your referral to orthopedics Dr. Lynann Bologna. If you do not hear within 2 weeks, give [place] a call.  If you have weakness in your hands or arms need to know immediately
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
## 2016-06-20 ENCOUNTER — Encounter: Payer: Managed Care, Other (non HMO) | Attending: Dermatology | Admitting: Dietician

## 2016-06-20 NOTE — Progress Notes (Signed)
After discussing the symptoms patient was having, we decided that he will follow up with his PCP in two weeks and discuss getting tested for celiac disease. If he does have celiac or possible autoimmune disease then he will follow up here to learn about following a gluten free diet.

## 2016-07-04 ENCOUNTER — Telehealth: Payer: Self-pay | Admitting: Family Medicine

## 2016-07-04 NOTE — Telephone Encounter (Signed)
Pt states he is developing a sinus infection and Dr Yong Channel will typically call him in a rx for this. Pt hopes Dr Yong Channel will do this again.  [facility]/ [address]/ [address]

## 2016-07-04 NOTE — Telephone Encounter (Signed)
Would advise office visit- id be willing to see him around noon. I do not tend to call in antibiotics unless discussed at prior visit.

## 2016-07-05 ENCOUNTER — Encounter: Payer: Self-pay | Admitting: Family Medicine

## 2016-07-05 ENCOUNTER — Ambulatory Visit (INDEPENDENT_AMBULATORY_CARE_PROVIDER_SITE_OTHER): Payer: Managed Care, Other (non HMO) | Admitting: Family Medicine

## 2016-07-05 VITALS — BP 150/90 | HR 78 | Temp 98.5°F | Ht 69.0 in | Wt 185.2 lb

## 2016-07-05 DIAGNOSIS — J449 Chronic obstructive pulmonary disease, unspecified: Secondary | ICD-10-CM | POA: Diagnosis not present

## 2016-07-05 DIAGNOSIS — B9689 Other specified bacterial agents as the cause of diseases classified elsewhere: Secondary | ICD-10-CM

## 2016-07-05 DIAGNOSIS — J329 Chronic sinusitis, unspecified: Secondary | ICD-10-CM | POA: Diagnosis not present

## 2016-07-05 MED ORDER — METHYLPREDNISOLONE ACETATE 80 MG/ML IJ SUSP
80.0000 mg | Freq: Once | INTRAMUSCULAR | Status: AC
  Administered 2016-07-05: 80 mg via INTRAMUSCULAR

## 2016-07-05 MED ORDER — AMOXICILLIN-POT CLAVULANATE 875-125 MG PO TABS: 1.0000 | ORAL_TABLET | Freq: Two times a day (BID) | ORAL | 0 refills | Status: DC

## 2016-07-05 NOTE — Patient Instructions (Signed)
  Sinsusitis Bacterial based on: Symptoms >10 days (at least 6 weeks in fact)  Treatment: -considered steroid: we opted for this- will use depo medrol 80mg  -other symptomatic care with nasal saline spray reasonable -Antibiotic indicated: yes  Finally, we reviewed reasons to return to care including if symptoms worsen or persist or new concerns arise (particularly fever or shortness of breath)  Meds ordered this encounter  Medications  . amoxicillin-clavulanate (AUGMENTIN) 875-125 MG tablet    Sig: Take 1 tablet by mouth 2 (two) times daily.    Dispense:  14 tablet    Refill:  0

## 2016-07-05 NOTE — Progress Notes (Signed)
Pre visit review using our clinic review tool, if applicable. No additional management support is needed unless otherwise documented below in the visit note. 
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
## 2016-07-05 NOTE — Telephone Encounter (Signed)
Spoke to patient and he is scheduled for noon today

## 2016-08-01 ENCOUNTER — Ambulatory Visit (INDEPENDENT_AMBULATORY_CARE_PROVIDER_SITE_OTHER): Payer: Managed Care, Other (non HMO) | Admitting: Family Medicine

## 2016-08-01 ENCOUNTER — Encounter: Payer: Self-pay | Admitting: Family Medicine

## 2016-08-01 VITALS — BP 118/72 | HR 97 | Temp 98.5°F | Ht 69.0 in | Wt 187.4 lb

## 2016-08-01 DIAGNOSIS — J320 Chronic maxillary sinusitis: Secondary | ICD-10-CM | POA: Diagnosis not present

## 2016-08-01 MED ORDER — LEVOFLOXACIN 500 MG PO TABS: 500.0000 mg | ORAL_TABLET | Freq: Every day | ORAL | 0 refills | Status: DC
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
## 2016-08-01 NOTE — Patient Instructions (Signed)
Levaquin 500mg  for 10 days. This is an aggressive/potent antibiotic. It has been linked to tendon pain even long term potentially so have to warn you of that. With that being said- you have failed other treatments so think we need to try this  We will call you about ENT referral- hopefully can be seen within 10 days or so

## 2016-08-01 NOTE — Progress Notes (Signed)
Pre visit review using our clinic review tool, if applicable. No additional management support is needed unless otherwise documented below in the visit note. 

## 2016-10-22 ENCOUNTER — Other Ambulatory Visit: Payer: Self-pay | Admitting: Family Medicine

## 2017-01-27 ENCOUNTER — Other Ambulatory Visit: Payer: Self-pay | Admitting: Family Medicine

## 2017-03-02 ENCOUNTER — Ambulatory Visit (INDEPENDENT_AMBULATORY_CARE_PROVIDER_SITE_OTHER): Payer: Managed Care, Other (non HMO) | Admitting: Family Medicine

## 2017-03-02 ENCOUNTER — Other Ambulatory Visit (HOSPITAL_COMMUNITY)
Admission: RE | Admit: 2017-03-02 | Discharge: 2017-03-02 | Disposition: A | Discharge status: Home / Self Care | Payer: Managed Care, Other (non HMO) | Source: Ambulatory Visit | Attending: Family Medicine | Admitting: Family Medicine

## 2017-03-02 ENCOUNTER — Encounter: Payer: Self-pay | Admitting: Family Medicine

## 2017-03-02 VITALS — BP 138/80 | HR 82 | Temp 98.2°F | Ht 69.0 in | Wt 185.2 lb

## 2017-03-02 DIAGNOSIS — Z0000 Encounter for general adult medical examination without abnormal findings: Secondary | ICD-10-CM

## 2017-03-02 DIAGNOSIS — R869 Unspecified abnormal finding in specimens from male genital organs: Secondary | ICD-10-CM

## 2017-03-02 DIAGNOSIS — F17200 Nicotine dependence, unspecified, uncomplicated: Secondary | ICD-10-CM

## 2017-03-02 DIAGNOSIS — J301 Allergic rhinitis due to pollen: Secondary | ICD-10-CM

## 2017-03-02 DIAGNOSIS — N401 Enlarged prostate with lower urinary tract symptoms: Secondary | ICD-10-CM

## 2017-03-02 DIAGNOSIS — J449 Chronic obstructive pulmonary disease, unspecified: Secondary | ICD-10-CM

## 2017-03-02 DIAGNOSIS — E785 Hyperlipidemia, unspecified: Secondary | ICD-10-CM

## 2017-03-02 DIAGNOSIS — R351 Nocturia: Secondary | ICD-10-CM | POA: Diagnosis not present

## 2017-03-02 DIAGNOSIS — N50811 Right testicular pain: Secondary | ICD-10-CM

## 2017-03-02 DIAGNOSIS — Z7251 High risk heterosexual behavior: Secondary | ICD-10-CM | POA: Diagnosis not present

## 2017-03-02 DIAGNOSIS — J4489 Other specified chronic obstructive pulmonary disease: Secondary | ICD-10-CM

## 2017-03-02 LAB — COMPREHENSIVE METABOLIC PANEL
ALT: 35 U/L (ref 0–53)
AST: 23 U/L (ref 0–37)
Albumin: 4.2 g/dL (ref 3.5–5.2)
Alkaline Phosphatase: 51 U/L (ref 39–117)
BILIRUBIN TOTAL: 0.6 mg/dL (ref 0.2–1.2)
BUN: 6 mg/dL (ref 6–23)
CO2: 26 meq/L (ref 19–32)
Calcium: 9.2 mg/dL (ref 8.4–10.5)
Chloride: 96 mEq/L (ref 96–112)
Creatinine, Ser: 0.7 mg/dL (ref 0.40–1.50)
GFR: 125.87 mL/min (ref >60.00)
GLUCOSE: 90 mg/dL (ref 70–99)
Potassium: 4.2 mEq/L (ref 3.5–5.1)
Sodium: 127 mEq/L — ABNORMAL LOW (ref 135–145)
Total Protein: 6.4 g/dL (ref 6.0–8.3)

## 2017-03-02 LAB — URINALYSIS, ROUTINE W REFLEX MICROSCOPIC
BILIRUBIN URINE: NEGATIVE
Ketones, ur: NEGATIVE
Nitrite: NEGATIVE
PH: 7 (ref 5.0–8.0)
TOTAL PROTEIN, URINE-UPE24: NEGATIVE
Urine Glucose: NEGATIVE
Urobilinogen, UA: 0.2 (ref 0.0–1.0)

## 2017-03-02 LAB — LDL CHOLESTEROL, DIRECT: Direct LDL: 83 mg/dL

## 2017-03-02 LAB — LIPID PANEL
CHOL/HDL RATIO: 4
Cholesterol: 146 mg/dL (ref 0–200)
HDL: 41 mg/dL (ref >39.00)
NONHDL: 104.97
Triglycerides: 215 mg/dL — ABNORMAL HIGH (ref 0.0–149.0)
VLDL: 43 mg/dL — ABNORMAL HIGH (ref 0.0–40.0)

## 2017-03-02 LAB — CBC
HCT: 44.6 % (ref 39.0–52.0)
HEMOGLOBIN: 15.3 g/dL (ref 13.0–17.0)
MCHC: 34.4 g/dL (ref 30.0–36.0)
MCV: 100.1 fl — ABNORMAL HIGH (ref 78.0–100.0)
Platelets: 294 10*3/uL (ref 150.0–400.0)
RBC: 4.45 Mil/uL (ref 4.22–5.81)
RDW: 13.1 % (ref 11.5–15.5)
WBC: 9 10*3/uL (ref 4.0–10.5)

## 2017-03-02 LAB — PSA: PSA: 5.79 ng/mL — AB (ref 0.10–4.00)

## 2017-03-02 MED ORDER — FLUTICASONE PROPIONATE 50 MCG/ACT NA SUSP: 2.0000 | Freq: Every day | NASAL | 5 refills | Status: DC

## 2017-03-02 NOTE — Addendum Note (Signed)
Addended by: Tomi Likens on: 03/02/2017 02:55 PM   Modules accepted: Orders
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
## 2017-03-02 NOTE — Addendum Note (Signed)
Addended by: Netta Neat D on: 03/02/2017 02:08 PM   Modules accepted: Orders

## 2017-03-02 NOTE — Patient Instructions (Addendum)
Use plain allegra instead of allegra D. Trial flonase daily. If no better in 1 month would add singulair as well- can call us about this.   One of the best things you can do for your health is to quit smoking. I think combining a 21mg  patch with 2 mg gum up to 10 pieces a day would be a great start. Then after 2 weeks could cut the patch to 14 mg, another 2 weeks cut to 7 mg patch then cut out after 2 weeks.   Please stop by lab before you go  We will call you within a week or two about your referral for ultrasound. If you do not hear within 3 weeks, give [place] a call.   If we do not find cause on ultrasound or labs and right testicular discomfort continues- consider treatment for prostate infection. Hopefully everything can get done in 2-3 weeks time.

## 2017-03-03 LAB — RPR

## 2017-03-03 LAB — HIV ANTIBODY (ROUTINE TESTING W REFLEX): HIV: NONREACTIVE

## 2017-03-05 ENCOUNTER — Encounter: Payer: Self-pay | Admitting: Family Medicine

## 2017-03-05 LAB — URINE CYTOLOGY ANCILLARY ONLY
CHLAMYDIA, DNA PROBE: NEGATIVE
NEISSERIA GONORRHEA: NEGATIVE
TRICH (WINDOWPATH): NEGATIVE

## 2017-03-06 ENCOUNTER — Other Ambulatory Visit: Payer: Self-pay | Admitting: Family Medicine

## 2017-03-06 ENCOUNTER — Other Ambulatory Visit: Payer: Self-pay

## 2017-03-06 DIAGNOSIS — E871 Hypo-osmolality and hyponatremia: Secondary | ICD-10-CM

## 2017-03-06 DIAGNOSIS — R82998 Other abnormal findings in urine: Secondary | ICD-10-CM

## 2017-03-06 MED ORDER — CIPROFLOXACIN HCL 500 MG PO TABS: 500.0000 mg | ORAL_TABLET | Freq: Two times a day (BID) | ORAL | 0 refills | Status: DC

## 2017-03-08 ENCOUNTER — Other Ambulatory Visit: Payer: Managed Care, Other (non HMO)

## 2017-03-08 ENCOUNTER — Other Ambulatory Visit: Payer: Self-pay

## 2017-03-08 DIAGNOSIS — R82998 Other abnormal findings in urine: Secondary | ICD-10-CM

## 2017-03-08 DIAGNOSIS — D72829 Elevated white blood cell count, unspecified: Secondary | ICD-10-CM

## 2017-03-10 LAB — URINE CULTURE

## 2017-03-12 ENCOUNTER — Other Ambulatory Visit: Payer: Self-pay

## 2017-03-12 DIAGNOSIS — N50811 Right testicular pain: Secondary | ICD-10-CM

## 2017-03-20 ENCOUNTER — Other Ambulatory Visit: Payer: Managed Care, Other (non HMO)

## 2017-03-22 ENCOUNTER — Other Ambulatory Visit (INDEPENDENT_AMBULATORY_CARE_PROVIDER_SITE_OTHER): Payer: Managed Care, Other (non HMO)

## 2017-03-22 ENCOUNTER — Other Ambulatory Visit: Payer: Self-pay

## 2017-03-22 DIAGNOSIS — N390 Urinary tract infection, site not specified: Secondary | ICD-10-CM

## 2017-03-22 DIAGNOSIS — E871 Hypo-osmolality and hyponatremia: Secondary | ICD-10-CM

## 2017-03-22 LAB — BASIC METABOLIC PANEL
BUN: 5 mg/dL — ABNORMAL LOW (ref 6–23)
CO2: 25 mEq/L (ref 19–32)
CREATININE: 0.74 mg/dL (ref 0.40–1.50)
Calcium: 9.7 mg/dL (ref 8.4–10.5)
Chloride: 99 mEq/L (ref 96–112)
GFR: 118.02 mL/min (ref >60.00)
Glucose, Bld: 101 mg/dL — ABNORMAL HIGH (ref 70–99)
POTASSIUM: 4.5 meq/L (ref 3.5–5.1)
Sodium: 132 mEq/L — ABNORMAL LOW (ref 135–145)

## 2017-03-23 LAB — URINE CULTURE
MICRO NUMBER:: 80979642
RESULT: NO GROWTH
SPECIMEN QUALITY:: ADEQUATE

## 2017-04-05 ENCOUNTER — Other Ambulatory Visit: Payer: Managed Care, Other (non HMO)

## 2017-04-12 ENCOUNTER — Other Ambulatory Visit: Payer: Managed Care, Other (non HMO)

## 2017-05-03 ENCOUNTER — Ambulatory Visit
Admission: RE | Admit: 2017-05-03 | Discharge: 2017-05-03 | Disposition: A | Discharge status: Home / Self Care | Payer: BLUE CROSS/BLUE SHIELD | Source: Ambulatory Visit | Attending: Family Medicine | Admitting: Family Medicine

## 2017-05-03 DIAGNOSIS — Z7251 High risk heterosexual behavior: Secondary | ICD-10-CM

## 2017-05-03 DIAGNOSIS — Z0000 Encounter for general adult medical examination without abnormal findings: Secondary | ICD-10-CM

## 2017-05-03 DIAGNOSIS — N50811 Right testicular pain: Secondary | ICD-10-CM

## 2017-05-03 DIAGNOSIS — R869 Unspecified abnormal finding in specimens from male genital organs: Secondary | ICD-10-CM

## 2017-06-27 ENCOUNTER — Other Ambulatory Visit: Payer: Self-pay | Admitting: Orthopedic Surgery

## 2017-06-27 DIAGNOSIS — M50121 Cervical disc disorder at C4-C5 level with radiculopathy: Secondary | ICD-10-CM

## 2017-06-29 ENCOUNTER — Ambulatory Visit
Admission: RE | Admit: 2017-06-29 | Discharge: 2017-06-29 | Disposition: A | Discharge status: Home / Self Care | Payer: BLUE CROSS/BLUE SHIELD | Source: Ambulatory Visit | Attending: Orthopedic Surgery | Admitting: Orthopedic Surgery

## 2017-06-29 DIAGNOSIS — M50121 Cervical disc disorder at C4-C5 level with radiculopathy: Secondary | ICD-10-CM

## 2017-11-05 ENCOUNTER — Other Ambulatory Visit: Payer: Self-pay | Admitting: Family Medicine

## 2018-04-01 ENCOUNTER — Other Ambulatory Visit: Payer: Self-pay | Admitting: Family Medicine

## 2018-04-01 NOTE — Telephone Encounter (Signed)
Pt needs appointment; left VM.

## 2018-04-01 NOTE — Telephone Encounter (Signed)
See note

## 2018-04-01 NOTE — Telephone Encounter (Signed)
Copied from [place] [id number]. Topic: Quick Communication - See Telephone Encounter >> Apr 01, 2018  1:52 PM Conception Chancy, NT wrote: CRM for notification. See Telephone encounter for: 04/01/18.  Patient is calling and requesting a refill on sildenafil (REVATIO) 20 MG tablet.  [facility] [place], [place] [address] [place] [address] Phone: [phone] Fax: [phone]

## 2018-04-03 NOTE — Telephone Encounter (Signed)
Left message on voicemail to call office. Pt needs to schedule an appointment.

## 2018-04-09 ENCOUNTER — Ambulatory Visit: Payer: PRIVATE HEALTH INSURANCE | Admitting: Family Medicine

## 2018-04-09 ENCOUNTER — Encounter: Payer: Self-pay | Admitting: Family Medicine

## 2018-04-09 VITALS — BP 144/82 | HR 104 | Temp 98.8°F | Ht 69.0 in | Wt 183.0 lb

## 2018-04-09 DIAGNOSIS — F17200 Nicotine dependence, unspecified, uncomplicated: Secondary | ICD-10-CM | POA: Diagnosis not present

## 2018-04-09 DIAGNOSIS — N5201 Erectile dysfunction due to arterial insufficiency: Secondary | ICD-10-CM

## 2018-04-09 DIAGNOSIS — E785 Hyperlipidemia, unspecified: Secondary | ICD-10-CM

## 2018-04-09 MED ORDER — ATORVASTATIN CALCIUM 20 MG PO TABS: ORAL_TABLET | ORAL | 2 refills | Status: DC

## 2018-04-09 MED ORDER — SILDENAFIL CITRATE 20 MG PO TABS: ORAL_TABLET | ORAL | 3 refills | Status: DC

## 2018-04-09 NOTE — Assessment & Plan Note (Signed)
S: controlled on atorvastatin 20mg  last year.  Lab Results  Component Value Date   CHOL 146 03/02/2017   HDL 41.00 03/02/2017   LDLCALC 78 03/10/2015   LDLDIRECT 83.0 03/02/2017   TRIG 215.0 (H) 03/02/2017   CHOLHDL 4 03/02/2017   A/P: we discussed repeating lipids today but he would prefer to do at CPE in next few months

## 2018-04-09 NOTE — Patient Instructions (Addendum)
Refills provided today- see you at your physical and we can update bloodwork. You dont have to be fasting since its later in the day. Only thing we cant check with that is your risk for diabetes. As long as triglycerides under 400, we can use it for cholesterol levels.  Declined flu shot- let us know if you change your mind

## 2018-04-09 NOTE — Assessment & Plan Note (Signed)
Patient continues to smoke 1.5 ppd, now up to 2 PPD- stressed with heavy work. chantix hasnt helped. Encouraged cessation. Quit for 9 years in past cold [place]. He states he just needs to decide to do that again- encouraged him to do so

## 2018-04-09 NOTE — Assessment & Plan Note (Signed)
S: has had good results with sildenafil but has run out A/P: we will refilll sildenafil today
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
## 2018-06-06 ENCOUNTER — Encounter: Payer: Self-pay | Admitting: Family Medicine

## 2018-06-06 ENCOUNTER — Ambulatory Visit (INDEPENDENT_AMBULATORY_CARE_PROVIDER_SITE_OTHER): Payer: PRIVATE HEALTH INSURANCE | Admitting: Family Medicine

## 2018-06-06 VITALS — BP 138/80 | HR 89 | Temp 98.3°F | Ht 68.0 in | Wt 182.0 lb

## 2018-06-06 DIAGNOSIS — J449 Chronic obstructive pulmonary disease, unspecified: Secondary | ICD-10-CM

## 2018-06-06 DIAGNOSIS — Z6827 Body mass index (BMI) 27.0-27.9, adult: Secondary | ICD-10-CM

## 2018-06-06 DIAGNOSIS — M47812 Spondylosis without myelopathy or radiculopathy, cervical region: Secondary | ICD-10-CM

## 2018-06-06 DIAGNOSIS — E785 Hyperlipidemia, unspecified: Secondary | ICD-10-CM | POA: Diagnosis not present

## 2018-06-06 DIAGNOSIS — R5383 Other fatigue: Secondary | ICD-10-CM

## 2018-06-06 DIAGNOSIS — F17200 Nicotine dependence, unspecified, uncomplicated: Secondary | ICD-10-CM

## 2018-06-06 DIAGNOSIS — Z125 Encounter for screening for malignant neoplasm of prostate: Secondary | ICD-10-CM

## 2018-06-06 DIAGNOSIS — Z0000 Encounter for general adult medical examination without abnormal findings: Secondary | ICD-10-CM

## 2018-06-06 NOTE — Assessment & Plan Note (Signed)
Cervical arthritis- has seen Dr. Lynann Bologna in the past has not wanted to pursue injections. Had spine surgery in January nad did well for first month- arm was better but since then has gotten worse again. Insurance issues so cant get CT scan and MRI. Lack of energy due to getting up with neck issues. Now Back also knotted up- working with massage therapist and chiropractor
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
## 2018-06-06 NOTE — Assessment & Plan Note (Signed)
Chronic asthmatic bronchitis-has done well recently without controllers/inhalers. No issues recently even with physical exertion - will monitor- high risk for COPD

## 2018-06-06 NOTE — Patient Instructions (Addendum)
Please stop by lab before you go  Consider Shingrix for next year- the new shingles shot- read about this- if you want to do it sooner than next year can call for nurse visit  Schedule a lab visit at the check out desk within 2 weeks. Return for future fasting labs meaning nothing but water after midnight please. Ok to take your medications with water.   I am going to reach out to you to do follow up testing of anal lesions. There are some potential treatments which may be difficult to self apply as would want to cover the whole area.   1 year physical. Follow up in a few weeks for anal cytology testing   Genital Warts Genital warts are small growths in the genital area or anal area. They are caused by a type of germ (HPV virus). This germ is spread from person to person during sex. It can be spread through vaginal, anal, and oral sex.  . Follow these instructions at home: Medicines  Apply over-the-counter and prescription medicines only as told by your doctor.  Do not use medicines that are meant for treating hand warts.  Talk with your doctor about using anti-itch creams. General instructions  Do not touch or scratch the warts.  Do not have sex until your treatment is done.  Tell your current and past sexual partners about your condition. They may need treatment.  Keep all follow-up visits as told by your doctor. This is important.  After treatment, use condoms during sex. Other Instructions for Women  Women who have genital warts may need to be checked more often for cervical cancer.  If you become pregnant, tell your doctor that you have had genital warts. The germ can be passed to the baby. Contact a doctor if:  You have redness, swelling, or pain in the area of the treated skin.  You have a fever.  You feel generally sick.  You feel lumps in and around your genital area or anal area.  You have bleeding in your genital area or anal area.  You have pain during  sex. This information is not intended to replace advice given to you by your health care provider. Make sure you discuss any questions you have with your health care provider. Document Released: 09/27/2009 Document Revised: 12/09/2015 Document Reviewed: 09/28/2014 Elsevier Interactive Patient Education  [employer].

## 2018-06-11 ENCOUNTER — Other Ambulatory Visit (INDEPENDENT_AMBULATORY_CARE_PROVIDER_SITE_OTHER): Payer: PRIVATE HEALTH INSURANCE

## 2018-06-11 DIAGNOSIS — Z125 Encounter for screening for malignant neoplasm of prostate: Secondary | ICD-10-CM | POA: Diagnosis not present

## 2018-06-11 DIAGNOSIS — F17200 Nicotine dependence, unspecified, uncomplicated: Secondary | ICD-10-CM | POA: Diagnosis not present

## 2018-06-11 DIAGNOSIS — R5383 Other fatigue: Secondary | ICD-10-CM

## 2018-06-11 DIAGNOSIS — E785 Hyperlipidemia, unspecified: Secondary | ICD-10-CM | POA: Diagnosis not present

## 2018-06-11 LAB — POC URINALSYSI DIPSTICK (AUTOMATED)
Bilirubin, UA: NEGATIVE
Blood, UA: NEGATIVE
GLUCOSE UA: NEGATIVE
KETONES UA: NEGATIVE
Leukocytes, UA: NEGATIVE
Nitrite, UA: NEGATIVE
PH UA: 7.5 (ref 5.0–8.0)
Protein, UA: NEGATIVE
Spec Grav, UA: 1.01 (ref 1.010–1.025)
UROBILINOGEN UA: 0.2 U/dL

## 2018-06-11 LAB — CBC WITH DIFFERENTIAL/PLATELET
Basophils Absolute: 0 10*3/uL (ref 0.0–0.1)
Basophils Relative: 0.5 % (ref 0.0–3.0)
EOS ABS: 0.1 10*3/uL (ref 0.0–0.7)
Eosinophils Relative: 1.6 % (ref 0.0–5.0)
HCT: 47.7 % (ref 39.0–52.0)
HEMOGLOBIN: 16.3 g/dL (ref 13.0–17.0)
Lymphocytes Relative: 14.7 % (ref 12.0–46.0)
Lymphs Abs: 1.3 10*3/uL (ref 0.7–4.0)
MCHC: 34.2 g/dL (ref 30.0–36.0)
MCV: 99.5 fl (ref 78.0–100.0)
Monocytes Absolute: 0.7 10*3/uL (ref 0.1–1.0)
Monocytes Relative: 7.8 % (ref 3.0–12.0)
NEUTROS PCT: 75.4 % (ref 43.0–77.0)
Neutro Abs: 6.8 10*3/uL (ref 1.4–7.7)
Platelets: 317 10*3/uL (ref 150.0–400.0)
RBC: 4.79 Mil/uL (ref 4.22–5.81)
RDW: 12.8 % (ref 11.5–15.5)
WBC: 9 10*3/uL (ref 4.0–10.5)

## 2018-06-11 LAB — PSA: PSA: 2.02 ng/mL (ref 0.10–4.00)

## 2018-06-11 LAB — LIPID PANEL
CHOL/HDL RATIO: 3
CHOLESTEROL: 153 mg/dL (ref 0–200)
HDL: 50.4 mg/dL (ref >39.00)
LDL CALC: 82 mg/dL (ref 0–99)
NonHDL: 103.01
TRIGLYCERIDES: 103 mg/dL (ref 0.0–149.0)
VLDL: 20.6 mg/dL (ref 0.0–40.0)

## 2018-06-11 LAB — COMPREHENSIVE METABOLIC PANEL
ALK PHOS: 60 U/L (ref 39–117)
ALT: 33 U/L (ref 0–53)
AST: 21 U/L (ref 0–37)
Albumin: 4.7 g/dL (ref 3.5–5.2)
BILIRUBIN TOTAL: 0.5 mg/dL (ref 0.2–1.2)
BUN: 9 mg/dL (ref 6–23)
CO2: 28 mEq/L (ref 19–32)
CREATININE: 0.86 mg/dL (ref 0.40–1.50)
Calcium: 9.6 mg/dL (ref 8.4–10.5)
Chloride: 100 mEq/L (ref 96–112)
GFR: 98.77 mL/min (ref >60.00)
Glucose, Bld: 96 mg/dL (ref 70–99)
Potassium: 4.6 mEq/L (ref 3.5–5.1)
SODIUM: 135 meq/L (ref 135–145)
TOTAL PROTEIN: 7.2 g/dL (ref 6.0–8.3)

## 2018-06-11 LAB — TSH: TSH: 1.1 u[IU]/mL (ref 0.35–4.50)

## 2018-06-20 ENCOUNTER — Encounter: Payer: Self-pay | Admitting: Family Medicine

## 2018-06-21 ENCOUNTER — Other Ambulatory Visit (HOSPITAL_COMMUNITY)
Admission: RE | Admit: 2018-06-21 | Discharge: 2018-06-21 | Disposition: A | Discharge status: Home / Self Care | Payer: PRIVATE HEALTH INSURANCE | Source: Ambulatory Visit | Attending: Family Medicine | Admitting: Family Medicine

## 2018-06-21 ENCOUNTER — Encounter: Payer: Self-pay | Admitting: Family Medicine

## 2018-06-21 ENCOUNTER — Telehealth: Payer: Self-pay | Admitting: *Deleted

## 2018-06-21 ENCOUNTER — Ambulatory Visit (INDEPENDENT_AMBULATORY_CARE_PROVIDER_SITE_OTHER): Payer: PRIVATE HEALTH INSURANCE | Admitting: Family Medicine

## 2018-06-21 VITALS — BP 130/79 | HR 88 | Temp 99.3°F | Ht 69.0 in | Wt 184.2 lb

## 2018-06-21 DIAGNOSIS — A630 Anogenital (venereal) warts: Secondary | ICD-10-CM | POA: Insufficient documentation

## 2018-06-21 MED ORDER — IMIQUIMOD 5 % EX CREA: TOPICAL_CREAM | CUTANEOUS | 1 refills | Status: DC

## 2018-06-21 NOTE — Telephone Encounter (Signed)
I did one refill to hopefully get to 16 weeks - Im ok to double current quantity and still give 1 refill if needed- please let pharmacy know
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
## 2018-06-21 NOTE — Patient Instructions (Signed)
?  Administration - Patients should apply imiquimod 5% cream to anogenital warts three times per week (eg, Monday, Wednesday, Friday) until there is total clearance of the anogenital warts or for a maximum of 16 weeks. A thin layer of imiquimod 5% cream should be applied to each wart and rubbed in until the cream is no longer visible. Imiquimod 5% cream should be applied prior to normal sleeping hours and left on the skin for 6 to 10 hours, after which the cream should be removed by washing the area with mild soap and water. The prescriber should demonstrate the proper application technique (including clear directions for the locations to treat and amount of medication to apply) to maximize benefit.  Can use cream up to 16 weeks - if you would like to do an 8 week check in to see how they look that would be reasonable  ?Adverse effects - Local inflammatory reactions, including redness, irritation, induration, ulceration, erosions, and vesicles, are common with the use of imiquimod 5% cream. A rest period of several days may be taken if necessary because of patient discomfort or severity of the reaction. Treatment may resume once the reaction subsides. Nonocclusive dressings (eg, cotton gauze or cotton underwear) may be used in the management of skin reactions.  In addition, hypopigmentation (lightening of skin) may develop at the site of treatment. Flu-like symptoms occur infrequently. Rare cases of vitiligo and reversible penile curvature secondary to edemahave been reported in association with imiquimod treatment.  Imiquimod can weaken condoms and vaginal diaphragms. Sexual contact should be avoided while imiquimod is on the skin

## 2018-06-21 NOTE — Assessment & Plan Note (Signed)
S: Patient with bumps near his rectum for unknown period of time but at least for some months prior to CPE in November. No pain with these. Has not noted more popping up recently. Does want to treat them if possible.   We reviewed risks of possible anal cancer and need for anal pap smear to look for atypical cells or high risk HPV A/P: anal warts- will get anal pap to make sure no high risk HPV or atypical changes. Discussed no treatment vs treatment option- patient wants to trial the imiquimod. Plan for up to 16 weeks treatment and offered  Week check in if patient desires

## 2018-06-21 NOTE — Telephone Encounter (Signed)
Copied from [place] [id number]. Topic: General - Other >> Jun 21, 2018 12:08 PM Carolyn Stare wrote:  Pharmacy call to say the quanity for the below med is going to last only up to 8 weeks and the instructions say up to 16 weeks .Pharmacy would like a call back

## 2018-06-21 NOTE — Telephone Encounter (Signed)
Please advise 

## 2018-06-27 ENCOUNTER — Encounter: Payer: Self-pay | Admitting: Family Medicine

## 2018-07-23 ENCOUNTER — Other Ambulatory Visit: Payer: Self-pay | Admitting: Family Medicine

## 2018-10-07 ENCOUNTER — Encounter: Payer: PRIVATE HEALTH INSURANCE | Admitting: Neurology

## 2018-10-18 ENCOUNTER — Encounter: Payer: Self-pay | Admitting: Family Medicine

## 2018-10-18 MED ORDER — SILDENAFIL CITRATE 20 MG PO TABS: ORAL_TABLET | ORAL | 2 refills | Status: DC

## 2018-11-05 ENCOUNTER — Encounter: Payer: PRIVATE HEALTH INSURANCE | Admitting: Neurology

## 2018-12-27 ENCOUNTER — Other Ambulatory Visit: Payer: Self-pay

## 2018-12-27 ENCOUNTER — Other Ambulatory Visit: Payer: PRIVATE HEALTH INSURANCE

## 2018-12-27 DIAGNOSIS — Z20822 Contact with and (suspected) exposure to covid-19: Secondary | ICD-10-CM

## 2019-01-01 ENCOUNTER — Encounter: Payer: Self-pay | Admitting: Family Medicine

## 2019-01-01 ENCOUNTER — Ambulatory Visit (INDEPENDENT_AMBULATORY_CARE_PROVIDER_SITE_OTHER): Payer: No Typology Code available for payment source | Admitting: Family Medicine

## 2019-01-01 VITALS — BP 144/72 | Ht 69.0 in | Wt 190.0 lb

## 2019-01-01 DIAGNOSIS — G4700 Insomnia, unspecified: Secondary | ICD-10-CM | POA: Diagnosis not present

## 2019-01-01 DIAGNOSIS — R5383 Other fatigue: Secondary | ICD-10-CM

## 2019-01-01 DIAGNOSIS — R6883 Chills (without fever): Secondary | ICD-10-CM | POA: Diagnosis not present

## 2019-01-01 DIAGNOSIS — R202 Paresthesia of skin: Secondary | ICD-10-CM

## 2019-01-01 MED ORDER — LORAZEPAM 0.5 MG PO TABS: 0.5000 mg | ORAL_TABLET | Freq: Every evening | ORAL | 1 refills | Status: DC | PRN
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
## 2019-01-01 NOTE — Patient Instructions (Signed)
There are no preventive care reminders to display for this patient.  Depression screen [facility] 2/9 06/06/2018 04/09/2018 06/20/2016  Decreased Interest 0 0 0  Down, Depressed, Hopeless 0 0 0  PHQ - 2 Score 0 0 0

## 2019-01-02 ENCOUNTER — Encounter: Payer: Self-pay | Admitting: Family Medicine

## 2019-01-02 ENCOUNTER — Other Ambulatory Visit (INDEPENDENT_AMBULATORY_CARE_PROVIDER_SITE_OTHER): Payer: No Typology Code available for payment source

## 2019-01-02 ENCOUNTER — Other Ambulatory Visit: Payer: Self-pay

## 2019-01-02 DIAGNOSIS — R202 Paresthesia of skin: Secondary | ICD-10-CM

## 2019-01-02 DIAGNOSIS — R6883 Chills (without fever): Secondary | ICD-10-CM

## 2019-01-02 DIAGNOSIS — R5383 Other fatigue: Secondary | ICD-10-CM | POA: Diagnosis not present

## 2019-01-02 LAB — VITAMIN B12: Vitamin B-12: 464 pg/mL (ref 211–911)

## 2019-01-02 LAB — CBC
HCT: 45 % (ref 39.0–52.0)
Hemoglobin: 15.3 g/dL (ref 13.0–17.0)
MCHC: 33.9 g/dL (ref 30.0–36.0)
MCV: 98.2 fl (ref 78.0–100.0)
Platelets: 305 10*3/uL (ref 150.0–400.0)
RBC: 4.58 Mil/uL (ref 4.22–5.81)
RDW: 13.5 % (ref 11.5–15.5)
WBC: 12.9 10*3/uL — ABNORMAL HIGH (ref 4.0–10.5)

## 2019-01-02 LAB — COMPREHENSIVE METABOLIC PANEL
ALT: 44 U/L (ref 0–53)
AST: 20 U/L (ref 0–37)
Albumin: 4.5 g/dL (ref 3.5–5.2)
Alkaline Phosphatase: 48 U/L (ref 39–117)
BUN: 7 mg/dL (ref 6–23)
CO2: 30 mEq/L (ref 19–32)
Calcium: 9.6 mg/dL (ref 8.4–10.5)
Chloride: 98 mEq/L (ref 96–112)
Creatinine, Ser: 0.86 mg/dL (ref 0.40–1.50)
GFR: 92.73 mL/min (ref >60.00)
Glucose, Bld: 117 mg/dL — ABNORMAL HIGH (ref 70–99)
Potassium: 4 mEq/L (ref 3.5–5.1)
Sodium: 136 mEq/L (ref 135–145)
Total Bilirubin: 0.4 mg/dL (ref 0.2–1.2)
Total Protein: 6.6 g/dL (ref 6.0–8.3)

## 2019-01-02 LAB — NOVEL CORONAVIRUS, NAA: SARS-CoV-2, NAA: NOT DETECTED

## 2019-01-02 LAB — TSH: TSH: 1.39 u[IU]/mL (ref 0.35–4.50)

## 2019-01-02 LAB — HEMOGLOBIN A1C: Hgb A1c MFr Bld: 5.9 % (ref 4.6–6.5)

## 2019-01-03 ENCOUNTER — Other Ambulatory Visit: Payer: Self-pay

## 2019-01-03 DIAGNOSIS — D72829 Elevated white blood cell count, unspecified: Secondary | ICD-10-CM

## 2019-01-03 LAB — SAR COV2 SEROLOGY (COVID19)AB(IGG),IA: SARS CoV2 AB IGG: NEGATIVE

## 2019-01-14 ENCOUNTER — Encounter: Payer: Self-pay | Admitting: Family Medicine

## 2019-01-21 NOTE — Telephone Encounter (Signed)
Called pt and scheduled visit for this coming Friday. He will bring BP cuff.

## 2019-01-24 ENCOUNTER — Encounter: Payer: Self-pay | Admitting: Family Medicine

## 2019-01-24 ENCOUNTER — Other Ambulatory Visit: Payer: Self-pay

## 2019-01-24 ENCOUNTER — Ambulatory Visit (INDEPENDENT_AMBULATORY_CARE_PROVIDER_SITE_OTHER): Payer: No Typology Code available for payment source | Admitting: Family Medicine

## 2019-01-24 VITALS — BP 137/89 | HR 85 | Temp 99.0°F | Ht 69.0 in | Wt 192.0 lb

## 2019-01-24 DIAGNOSIS — E663 Overweight: Secondary | ICD-10-CM | POA: Diagnosis not present

## 2019-01-24 DIAGNOSIS — J449 Chronic obstructive pulmonary disease, unspecified: Secondary | ICD-10-CM

## 2019-01-24 DIAGNOSIS — D72829 Elevated white blood cell count, unspecified: Secondary | ICD-10-CM | POA: Diagnosis not present

## 2019-01-24 DIAGNOSIS — G4700 Insomnia, unspecified: Secondary | ICD-10-CM

## 2019-01-24 DIAGNOSIS — R030 Elevated blood-pressure reading, without diagnosis of hypertension: Secondary | ICD-10-CM | POA: Diagnosis not present

## 2019-01-24 DIAGNOSIS — R5383 Other fatigue: Secondary | ICD-10-CM

## 2019-01-24 LAB — CBC WITH DIFFERENTIAL/PLATELET
Basophils Absolute: 0.1 10*3/uL (ref 0.0–0.1)
Basophils Relative: 0.5 % (ref 0.0–3.0)
Eosinophils Absolute: 0.3 10*3/uL (ref 0.0–0.7)
Eosinophils Relative: 2.3 % (ref 0.0–5.0)
HCT: 42.6 % (ref 39.0–52.0)
Hemoglobin: 14.5 g/dL (ref 13.0–17.0)
Lymphocytes Relative: 15 % (ref 12.0–46.0)
Lymphs Abs: 1.6 10*3/uL (ref 0.7–4.0)
MCHC: 34.1 g/dL (ref 30.0–36.0)
MCV: 98.1 fl (ref 78.0–100.0)
Monocytes Absolute: 0.8 10*3/uL (ref 0.1–1.0)
Monocytes Relative: 7.8 % (ref 3.0–12.0)
Neutro Abs: 8 10*3/uL — ABNORMAL HIGH (ref 1.4–7.7)
Neutrophils Relative %: 74.4 % (ref 43.0–77.0)
Platelets: 308 10*3/uL (ref 150.0–400.0)
RBC: 4.35 Mil/uL (ref 4.22–5.81)
RDW: 13.5 % (ref 11.5–15.5)
WBC: 10.8 10*3/uL — ABNORMAL HIGH (ref 4.0–10.5)

## 2019-01-24 LAB — IBC + FERRITIN
Ferritin: 224.7 ng/mL (ref 22.0–322.0)
Iron: 97 ug/dL (ref 42–165)
Saturation Ratios: 23.9 % (ref 20.0–50.0)
Transferrin: 290 mg/dL (ref 212.0–360.0)

## 2019-01-24 LAB — VITAMIN D 25 HYDROXY (VIT D DEFICIENCY, FRACTURES): VITD: 31.51 ng/mL (ref 30.00–100.00)

## 2019-01-24 NOTE — Patient Instructions (Addendum)
Please stop by lab before you go If you do not have mychart- we will call you about results within 5 business days of [place] receiving them.  If you have mychart- we will send your results within 3 business days of [place] receiving them.  If abnormal or we want to clarify a result, we will call or mychart you to make sure you receive the message.  If you have questions or concerns or don't hear within 5-7 days, please send [place] a message or call us.    Sorry ativan didn't work- removed from [employer]. Hopeful nerve conduction study gives you some answers
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
## 2019-01-27 LAB — PATHOLOGIST SMEAR REVIEW

## 2019-01-29 ENCOUNTER — Encounter: Payer: Self-pay | Admitting: Neurology

## 2019-01-29 ENCOUNTER — Ambulatory Visit (INDEPENDENT_AMBULATORY_CARE_PROVIDER_SITE_OTHER): Payer: No Typology Code available for payment source | Admitting: Neurology

## 2019-01-29 ENCOUNTER — Other Ambulatory Visit: Payer: Self-pay

## 2019-01-29 DIAGNOSIS — G5603 Carpal tunnel syndrome, bilateral upper limbs: Secondary | ICD-10-CM | POA: Diagnosis not present

## 2019-01-29 HISTORY — DX: Carpal tunnel syndrome, bilateral upper limbs: G56.03

## 2019-01-29 NOTE — Procedures (Signed)
     HISTORY:  Joshua Manning is a 53 year old gentleman with a history of bilateral hand numbness, the left hand started bothering him 1 year ago.  The patient has a history of prior cervical spine surgery done in January 2019.  The patient indicates that the hands are numb all the time, they wake him up frequently at night, he is being evaluated for possible neuropathy or a cervical radiculopathy.  NERVE CONDUCTION STUDIES:  Nerve conduction studies were performed on both upper extremities.  The distal motor latencies for the median nerves were prolonged bilaterally with low motor amplitude the right median nerve, normal on the left.  The distal motor latencies and motor amplitudes for the ulnar nerves were normal bilaterally.  The nerve conduction velocities for the median nerves were normal bilaterally, there was some slowing bilaterally for the ulnar nerves above the elbows bilaterally.  The sensory latencies for the median nerves were prolonged bilaterally, the sensory latencies for the ulnar nerves bilaterally and for the left radial nerve were normal.  The F-wave latencies for the ulnar nerves were normal bilaterally.  EMG STUDIES:  EMG study was performed on the left upper extremity:  The first dorsal interosseous muscle reveals 2 to 4 K units with full recruitment. No fibrillations or positive waves were noted. The abductor pollicis brevis muscle reveals 2 to 4 K units with slightly reduced recruitment. No fibrillations or positive waves were noted. The extensor indicis proprius muscle reveals 1 to 3 K units with full recruitment. No fibrillations or positive waves were noted. The pronator teres muscle reveals 2 to 3 K units with full recruitment. No fibrillations or positive waves were noted. The biceps muscle reveals 1 to 2 K units with full recruitment. No fibrillations or positive waves were noted. The triceps muscle reveals 2 to 4 K units with full recruitment. No fibrillations or  positive waves were noted. The anterior deltoid muscle reveals 2 to 3 K units with full recruitment. No fibrillations or positive waves were noted. The cervical paraspinal muscles were tested at 2 levels. No abnormalities of insertional activity were seen at either level tested. There was good relaxation.   IMPRESSION:  Nerve conduction studies were performed on both upper extremities.  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  EMG evaluation of the left upper extremity does not show evidence of an overlying cervical radiculopathy.  Jill Alexanders MD 01/29/2019 2:39 PM  [facility] [address] [place], [place] [address]  Phone [phone] Fax [phone]

## 2019-01-29 NOTE — Progress Notes (Signed)
Please refer to EMG and nerve conduction procedure note.  

## 2019-01-29 NOTE — Progress Notes (Signed)
Electra    Nerve / Sites Muscle Latency Ref. Amplitude Ref. Rel Amp Segments Distance Velocity Ref. Area    ms ms mV mV %  cm m/s m/s mVms  R Median - APB     Wrist APB 4.9 ?4.4 3.7 ?4.0 100 Wrist - APB 7   12.6     Upper arm APB 9.2  3.7  100 Upper arm - Wrist 23 53 ?49 11.8  L Median - APB     Wrist APB 4.5 ?4.4 5.3 ?4.0 100 Wrist - APB 7   15.4     Upper arm APB 8.4  4.8  89.4 Upper arm - Wrist 20 51 ?49 14.3  R Ulnar - ADM     Wrist ADM 2.0 ?3.3 9.0 ?6.0 100 Wrist - ADM 7   27.2     B.Elbow ADM 5.6  8.4  93.4 B.Elbow - Wrist 20 56 ?49 25.9     A.Elbow ADM 8.2  8.3  98.3 A.Elbow - B.Elbow 10 38 ?49 26.1         A.Elbow - Wrist      L Ulnar - ADM     Wrist ADM 2.1 ?3.3 8.8 ?6.0 100 Wrist - ADM 7   27.0     B.Elbow ADM 5.6  8.0  90.4 B.Elbow - Wrist 20 58 ?49 25.3     A.Elbow ADM 8.0  7.6  95.9 A.Elbow - B.Elbow 10 41 ?49 25.0         A.Elbow - Wrist                 SNC    Nerve / Sites Rec. Site Peak Lat Ref.  Amp Ref. Segments Distance    ms ms V V  cm  L Radial - Anatomical snuff box (Forearm)     Forearm Wrist 2.3 ?2.9 18 ?15 Forearm - Wrist 10  R Median - Orthodromic (Dig II, Mid palm)     Dig II Wrist 4.0 ?3.4 2 ?10 Dig II - Wrist 13  L Median - Orthodromic (Dig II, Mid palm)     Dig II Wrist 3.7 ?3.4 8 ?10 Dig II - Wrist 13  R Ulnar - Orthodromic, (Dig V, Mid palm)     Dig V Wrist 2.9 ?3.1 11 ?5 Dig V - Wrist 11  L Ulnar - Orthodromic, (Dig V, Mid palm)     Dig V Wrist 2.7 ?3.1 4 ?5 Dig V - Wrist 3               F  Wave    Nerve F Lat Ref.   ms ms  R Ulnar - ADM 31.7 ?32.0  L Ulnar - ADM 29.2 ?32.0

## 2019-01-30 NOTE — Progress Notes (Signed)
Lab visit has been canceled.

## 2019-02-03 ENCOUNTER — Other Ambulatory Visit: Payer: PRIVATE HEALTH INSURANCE

## 2019-02-20 ENCOUNTER — Other Ambulatory Visit: Payer: Self-pay

## 2019-02-20 DIAGNOSIS — Z20822 Contact with and (suspected) exposure to covid-19: Secondary | ICD-10-CM

## 2019-02-22 ENCOUNTER — Other Ambulatory Visit: Payer: Self-pay | Admitting: Family Medicine

## 2019-02-22 LAB — NOVEL CORONAVIRUS, NAA: SARS-CoV-2, NAA: NOT DETECTED

## 2019-02-22 LAB — SPECIMEN STATUS REPORT

## 2019-02-25 ENCOUNTER — Encounter: Payer: Self-pay | Admitting: Family Medicine

## 2019-02-25 ENCOUNTER — Telehealth (INDEPENDENT_AMBULATORY_CARE_PROVIDER_SITE_OTHER): Payer: No Typology Code available for payment source | Admitting: Family Medicine

## 2019-02-25 ENCOUNTER — Other Ambulatory Visit: Payer: Self-pay

## 2019-02-25 DIAGNOSIS — J019 Acute sinusitis, unspecified: Secondary | ICD-10-CM | POA: Diagnosis not present

## 2019-02-25 MED ORDER — DOXYCYCLINE HYCLATE 100 MG PO TABS: 100.0000 mg | ORAL_TABLET | Freq: Two times a day (BID) | ORAL | 0 refills | Status: DC

## 2019-02-25 NOTE — Patient Instructions (Signed)
-  I sent the medication we discussed  Meds ordered this encounter  Medications  . doxycycline (VIBRA-TABS) 100 MG tablet    Sig: Take 1 tablet (100 mg total) by mouth 2 (two) times daily.    Dispense:  14 tablet    Refill:  0  to the pharmacy.  Please let us know if you have any questions or concerns regarding this prescription.  I hope you are feeling better soon! Seek care promptly if your symptoms worsen, new concerns arise or you are not improving with treatment.

## 2019-02-25 NOTE — Progress Notes (Signed)
Virtual Visit via Video Note  I connected with Lanny Hurst  on 02/25/19 at  3:20 PM EDT by a video enabled telemedicine application and verified that I am speaking with the correct person using two identifiers.  Location patient: home Location provider:work or home office Persons participating in the virtual visit: patient, provider  I discussed the limitations of evaluation and management by telemedicine and the availability of in person appointments. The patient expressed understanding and agreed to proceed.   HPI:  Acute visit for sinus congestion: -started 2 weeks ago -symptoms: nasal congestion, swollen glands, thick sinus congestion - sinus discomfort bilat nose,drainage, cough -no SOB, fevers, diarrhea, nausea, vomiting, flu like symptoms -reports history of sinusitis, had sinus surgery for deviated septum in the past, last abx was last year -denies concerns for coronavirus, reports had negative COVID19 test last week   ROS: See pertinent positives and negatives per HPI.  Past Medical History:  Diagnosis Date  . Allergy   . Arthritis   . Bilateral carpal tunnel syndrome 01/29/2019  . Colon polyps   . Hemorrhoids   . HEMORRHOIDS, INTERNAL 03/28/2007   Qualifier: Diagnosis of  By: Arnoldo Morale MD, Balinda Quails   . Hyperlipidemia   . Migraine 2010   "went away after 2010"  . Pneumonia   . PROSTATITIS, ACUTE, CHRONIC 03/28/2007   Qualifier: Diagnosis of  By: Arnoldo Morale MD, Balinda Quails     Past Surgical History:  Procedure Laterality Date  . APPENDECTOMY    . SHOULDER OPEN ROTATOR CUFF REPAIR Right 1990's   3 surgeries in 90s to R shoulder, 2 scopes, 1 open  . TONSILLECTOMY      Family History  Problem Relation Age of Onset  . Ovarian cancer Mother   . Breast cancer Mother        recurrent again 2019  . Irritable bowel syndrome Mother   . Esophageal cancer Mother        2019. also in the brain- brain bleed with this. radiation planned  . Other Father        unknown history  . Colon  polyps Maternal Grandmother   . Diabetes Maternal Grandmother   . Heart disease Maternal Grandfather   . Liver disease Maternal Grandfather   . Colon cancer Neg Hx     SOCIAL HX: see hpi   Current Outpatient Medications:  .  atorvastatin (LIPITOR) 20 MG tablet, TAKE ONE TABLET BY MOUTH DAILY, Disp: 90 tablet, Rfl: 1 .  Multiple Vitamin (MULTIVITAMIN) capsule, Take 1 capsule by mouth daily.  , Disp: , Rfl:  .  sildenafil (REVATIO) 20 MG tablet, TAKE 2-5 TABLETS BY MOUTH AS NEEDED FOR SEXUAL ACTIVITY, Disp: 50 tablet, Rfl: 2 .  doxycycline (VIBRA-TABS) 100 MG tablet, Take 1 tablet (100 mg total) by mouth 2 (two) times daily., Disp: 14 tablet, Rfl: 0  EXAM:  VITALS per patient if applicable: -T 06.2 BP 694/85  GENERAL: alert, oriented, appears well and in no acute distress  HEENT: atraumatic, conjunttiva clear, no obvious abnormalities on inspection of external nose and ears  NECK: normal movements of the head and neck  LUNGS: on inspection no signs of respiratory distress, breathing rate appears normal, no obvious gross SOB, gasping or wheezing  CV: no obvious cyanosis  MS: moves all visible extremities without noticeable abnormality  PSYCH/NEURO: pleasant and cooperative, no obvious depression or anxiety, speech and thought processing grossly intact  ASSESSMENT AND PLAN:  Discussed the following assessment and plan:  Acute non-recurrent sinusitis, unspecified location   -  we discussed possible serious and likely etiologies, workup and treatment, treatment risks and return precautions. Concern for sinusitis given worsening, thick mucus with 14 days of symptoms. -after this discussion, Arney opted for empirix tx with doxy 100mg  bid x 7 days after discussion risks.  -follow up advised if symptoms worsen or persist or new concerns arise.   Lucretia Kern, DO   Patient Instructions   -I sent the medication we discussed  Meds ordered this encounter  Medications  .  doxycycline (VIBRA-TABS) 100 MG tablet    Sig: Take 1 tablet (100 mg total) by mouth 2 (two) times daily.    Dispense:  14 tablet    Refill:  0  to the pharmacy.  Please let us know if you have any questions or concerns regarding this prescription.  I hope you are feeling better soon! Seek care promptly if your symptoms worsen, new concerns arise or you are not improving with treatment.

## 2019-03-19 ENCOUNTER — Other Ambulatory Visit: Payer: Self-pay | Admitting: Dermatology

## 2019-06-28 ENCOUNTER — Encounter: Payer: Self-pay | Admitting: Family Medicine

## 2019-06-30 MED ORDER — SILDENAFIL CITRATE 20 MG PO TABS: ORAL_TABLET | ORAL | 2 refills | Status: DC

## 2019-06-30 NOTE — Telephone Encounter (Signed)
LAST APPOINTMENT DATE: @07 /04/2019  NEXT APPOINTMENT DATE:@2 /10/2019   LAST REFILL:10/18/2018  QTY:50 / 2 RF

## 2019-08-21 ENCOUNTER — Other Ambulatory Visit: Payer: Self-pay

## 2019-08-21 ENCOUNTER — Ambulatory Visit (INDEPENDENT_AMBULATORY_CARE_PROVIDER_SITE_OTHER): Payer: No Typology Code available for payment source | Admitting: Family Medicine

## 2019-08-21 ENCOUNTER — Encounter: Payer: Self-pay | Admitting: Family Medicine

## 2019-08-21 VITALS — BP 140/80 | HR 92 | Temp 98.0°F | Ht 69.0 in | Wt 197.0 lb

## 2019-08-21 DIAGNOSIS — J4489 Other specified chronic obstructive pulmonary disease: Secondary | ICD-10-CM

## 2019-08-21 DIAGNOSIS — K625 Hemorrhage of anus and rectum: Secondary | ICD-10-CM

## 2019-08-21 DIAGNOSIS — Z0000 Encounter for general adult medical examination without abnormal findings: Secondary | ICD-10-CM

## 2019-08-21 DIAGNOSIS — Z125 Encounter for screening for malignant neoplasm of prostate: Secondary | ICD-10-CM | POA: Diagnosis not present

## 2019-08-21 DIAGNOSIS — J449 Chronic obstructive pulmonary disease, unspecified: Secondary | ICD-10-CM | POA: Diagnosis not present

## 2019-08-21 DIAGNOSIS — I10 Essential (primary) hypertension: Secondary | ICD-10-CM

## 2019-08-21 DIAGNOSIS — F17200 Nicotine dependence, unspecified, uncomplicated: Secondary | ICD-10-CM | POA: Diagnosis not present

## 2019-08-21 DIAGNOSIS — A630 Anogenital (venereal) warts: Secondary | ICD-10-CM

## 2019-08-21 DIAGNOSIS — E785 Hyperlipidemia, unspecified: Secondary | ICD-10-CM | POA: Diagnosis not present

## 2019-08-21 DIAGNOSIS — J301 Allergic rhinitis due to pollen: Secondary | ICD-10-CM

## 2019-08-21 LAB — LIPID PANEL
Cholesterol: 178 mg/dL (ref 0–200)
HDL: 49.5 mg/dL (ref >39.00)
NonHDL: 128.91
Total CHOL/HDL Ratio: 4
Triglycerides: 332 mg/dL — ABNORMAL HIGH (ref 0.0–149.0)
VLDL: 66.4 mg/dL — ABNORMAL HIGH (ref 0.0–40.0)

## 2019-08-21 LAB — CBC WITH DIFFERENTIAL/PLATELET
Basophils Absolute: 0.1 10*3/uL (ref 0.0–0.1)
Basophils Relative: 1 % (ref 0.0–3.0)
Eosinophils Absolute: 0.3 10*3/uL (ref 0.0–0.7)
Eosinophils Relative: 2.2 % (ref 0.0–5.0)
HCT: 42.4 % (ref 39.0–52.0)
Hemoglobin: 14.3 g/dL (ref 13.0–17.0)
Lymphocytes Relative: 20.6 % (ref 12.0–46.0)
Lymphs Abs: 2.4 10*3/uL (ref 0.7–4.0)
MCHC: 33.7 g/dL (ref 30.0–36.0)
MCV: 98 fl (ref 78.0–100.0)
Monocytes Absolute: 0.8 10*3/uL (ref 0.1–1.0)
Monocytes Relative: 7.3 % (ref 3.0–12.0)
Neutro Abs: 8 10*3/uL — ABNORMAL HIGH (ref 1.4–7.7)
Neutrophils Relative %: 68.9 % (ref 43.0–77.0)
Platelets: 288 10*3/uL (ref 150.0–400.0)
RBC: 4.33 Mil/uL (ref 4.22–5.81)
RDW: 12.9 % (ref 11.5–15.5)
WBC: 11.6 10*3/uL — ABNORMAL HIGH (ref 4.0–10.5)

## 2019-08-21 LAB — COMPREHENSIVE METABOLIC PANEL
ALT: 56 U/L — ABNORMAL HIGH (ref 0–53)
AST: 29 U/L (ref 0–37)
Albumin: 4.6 g/dL (ref 3.5–5.2)
Alkaline Phosphatase: 50 U/L (ref 39–117)
BUN: 8 mg/dL (ref 6–23)
CO2: 25 mEq/L (ref 19–32)
Calcium: 9.4 mg/dL (ref 8.4–10.5)
Chloride: 98 mEq/L (ref 96–112)
Creatinine, Ser: 0.77 mg/dL (ref 0.40–1.50)
GFR: 105.1 mL/min (ref >60.00)
Glucose, Bld: 93 mg/dL (ref 70–99)
Potassium: 4.1 mEq/L (ref 3.5–5.1)
Sodium: 131 mEq/L — ABNORMAL LOW (ref 135–145)
Total Bilirubin: 0.3 mg/dL (ref 0.2–1.2)
Total Protein: 7.1 g/dL (ref 6.0–8.3)

## 2019-08-21 LAB — LDL CHOLESTEROL, DIRECT: Direct LDL: 94 mg/dL

## 2019-08-21 LAB — PSA: PSA: 1.79 ng/mL (ref 0.10–4.00)
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
## 2019-08-21 NOTE — Patient Instructions (Addendum)
Blood pressure high normal or high most of time. Home readings high 130s or 140s. He prefers not to use medicine if possible. Goal blood pressure 130/80 or less but if can average 138/88 or less- we will not add medcine.   He is already very active with work. We do think we could lower overall salt content which may help. Encouraged dash eating plan.   We will call you within two weeks about your referral to GI due to rectal bleeding. If you do not hear within 3 weeks, give [place] a call.  If blood counts/hemoglobin low may need to change this to stat  STOP aspirin- we need rectal bleeding to stop and this may help.   Please stop smoking-one of the best things you can do for your health  Sorry to hear about your fianc's cancer-we are praying/rooting for the best for her and her treatments.  Recommended follow up: Follow up 1-2 months to recheck blood pressure. Quitting smoking would also help blood pressure.     DASH Eating Plan DASH stands for "Dietary Approaches to Stop Hypertension." The DASH eating plan is a healthy eating plan that has been shown to reduce high blood pressure (hypertension). It may also reduce your risk for type 2 diabetes, heart disease, and stroke. The DASH eating plan may also help with weight loss. What are tips for following this plan?  General guidelines  Avoid eating more than 2,300 mg (milligrams) of salt (sodium) a day. If you have hypertension, you may need to reduce your sodium intake to 1,500 mg a day.  Limit alcohol intake to no more than 1 drink a day for nonpregnant women and 2 drinks a day for men. One drink equals 12 oz of beer, 5 oz of wine, or 1 oz of hard liquor.  Work with your health care provider to maintain a healthy body weight or to lose weight. Ask what an ideal weight is for you.  Get at least 30 minutes of exercise that causes your heart to beat faster (aerobic exercise) most days of the week. Activities may include walking, swimming, or  biking.  Work with your health care provider or diet and nutrition specialist (dietitian) to adjust your eating plan to your individual calorie needs. Reading food labels   Check food labels for the amount of sodium per serving. Choose foods with less than 5 percent of the Daily Value of sodium. Generally, foods with less than 300 mg of sodium per serving fit into this eating plan.  To find whole grains, look for the word "whole" as the first word in the ingredient list. Shopping  Buy products labeled as "low-sodium" or "no salt added."  Buy fresh foods. Avoid canned foods and premade or frozen meals. Cooking  Avoid adding salt when cooking. Use salt-free seasonings or herbs instead of table salt or sea salt. Check with your health care provider or pharmacist before using salt substitutes.  Do not fry foods. Cook foods using healthy methods such as baking, boiling, grilling, and broiling instead.  Cook with heart-healthy oils, such as olive, canola, soybean, or sunflower oil. Meal planning  Eat a balanced diet that includes: ? 5 or more servings of fruits and vegetables each day. At each meal, try to fill half of your plate with fruits and vegetables. ? Up to 6-8 servings of whole grains each day. ? Less than 6 oz of lean meat, poultry, or fish each day. A 3-oz serving of meat is about the  same size as a deck of cards. One egg equals 1 oz. ? 2 servings of low-fat dairy each day. ? A serving of nuts, seeds, or beans 5 times each week. ? Heart-healthy fats. Healthy fats called Omega-3 fatty acids are found in foods such as flaxseeds and coldwater fish, like sardines, salmon, and mackerel.  Limit how much you eat of the following: ? Canned or prepackaged foods. ? Food that is high in trans fat, such as fried foods. ? Food that is high in saturated fat, such as fatty meat. ? Sweets, desserts, sugary drinks, and other foods with added sugar. ? Full-fat dairy products.  Do not salt  foods before eating.  Try to eat at least 2 vegetarian meals each week.  Eat more home-cooked food and less restaurant, buffet, and fast food.  When eating at a restaurant, ask that your food be prepared with less salt or no salt, if possible. What foods are recommended? The items listed may not be a complete list. Talk with your dietitian about what dietary choices are best for you. Grains Whole-grain or whole-wheat bread. Whole-grain or whole-wheat pasta. Brown rice. [place]. Bulgur. Whole-grain and low-sodium cereals. Pita bread. Low-fat, low-sodium crackers. Whole-wheat flour tortillas. Vegetables Fresh or frozen vegetables (raw, steamed, roasted, or grilled). Low-sodium or reduced-sodium tomato and vegetable juice. Low-sodium or reduced-sodium tomato sauce and tomato paste. Low-sodium or reduced-sodium canned vegetables. Fruits All fresh, dried, or frozen fruit. Canned fruit in natural juice (without added sugar). Meat and other protein foods Skinless chicken or [place]. Ground chicken or [place]. Pork with fat trimmed off. Fish and seafood. Egg whites. Dried beans, peas, or lentils. Unsalted nuts, nut butters, and seeds. Unsalted canned beans. Lean cuts of beef with fat trimmed off. Low-sodium, lean deli meat. Dairy Low-fat (1%) or fat-free (skim) milk. Fat-free, low-fat, or reduced-fat cheeses. Nonfat, low-sodium ricotta or cottage cheese. Low-fat or nonfat yogurt. Low-fat, low-sodium cheese. Fats and oils Soft margarine without trans fats. Vegetable oil. Low-fat, reduced-fat, or light mayonnaise and salad dressings (reduced-sodium). Canola, safflower, olive, soybean, and sunflower oils. Avocado. Seasoning and other foods Herbs. Spices. Seasoning mixes without salt. Unsalted popcorn and pretzels. Fat-free sweets. What foods are not recommended? The items listed may not be a complete list. Talk with your dietitian about what dietary choices are best for you. Grains Baked goods  made with fat, such as croissants, muffins, or some breads. Dry pasta or rice meal packs. Vegetables Creamed or fried vegetables. Vegetables in a cheese sauce. Regular canned vegetables (not low-sodium or reduced-sodium). Regular canned tomato sauce and paste (not low-sodium or reduced-sodium). Regular tomato and vegetable juice (not low-sodium or reduced-sodium). Angie Fava. Olives. Fruits Canned fruit in a light or heavy syrup. Fried fruit. Fruit in cream or butter sauce. Meat and other protein foods Fatty cuts of meat. Ribs. Fried meat. Berniece Salines. Sausage. Bologna and other processed lunch meats. Salami. Fatback. Hotdogs. Bratwurst. Salted nuts and seeds. Canned beans with added salt. Canned or smoked fish. Whole eggs or egg yolks. Chicken or [place] with skin. Dairy Whole or 2% milk, cream, and half-and-half. Whole or full-fat cream cheese. Whole-fat or sweetened yogurt. Full-fat cheese. Nondairy creamers. Whipped toppings. Processed cheese and cheese spreads. Fats and oils Butter. Stick margarine. Lard. Shortening. Ghee. Bacon fat. Tropical oils, such as coconut, palm kernel, or palm oil. Seasoning and other foods Salted popcorn and pretzels. Onion salt, garlic salt, seasoned salt, table salt, and sea salt. Worcestershire sauce. Tartar sauce. Barbecue sauce. Teriyaki sauce. Soy sauce, including  reduced-sodium. Steak sauce. Canned and packaged gravies. Fish sauce. Oyster sauce. Cocktail sauce. Horseradish that you find on the shelf. Ketchup. Mustard. Meat flavorings and tenderizers. Bouillon cubes. Hot sauce and Tabasco sauce. Premade or packaged marinades. Premade or packaged taco seasonings. Relishes. Regular salad dressings. Where to find more information:  National Heart, Lung, and [place]: https://wilson-eaton.com/  American Heart Association: www.heart.org Summary  The DASH eating plan is a healthy eating plan that has been shown to reduce high blood pressure (hypertension). It may also reduce  your risk for type 2 diabetes, heart disease, and stroke.  With the DASH eating plan, you should limit salt (sodium) intake to 2,300 mg a day. If you have hypertension, you may need to reduce your sodium intake to 1,500 mg a day.  When on the DASH eating plan, aim to eat more fresh fruits and vegetables, whole grains, lean proteins, low-fat dairy, and heart-healthy fats.  Work with your health care provider or diet and nutrition specialist (dietitian) to adjust your eating plan to your individual calorie needs. This information is not intended to replace advice given to you by your health care provider. Make sure you discuss any questions you have with your health care provider. Document Revised: 06/15/2017 Document Reviewed: 06/26/2016 Elsevier Patient Education  2020 [employer].

## 2019-08-21 NOTE — Assessment & Plan Note (Signed)
Blood pressure high normal or high most of time. Home readings high 130s or 140s. He prefers not to use medicine if possible. Goal blood pressure 130/80 or less but if can average 138/88 or less- we will not add medcine.   He is already very active with work. We do think we could lower overall salt content which may help. Encouraged dash eating plan.   Follow up 1-2 months to recheck blood pressure. Quitting smoking would also help blood pressure.

## 2019-08-22 ENCOUNTER — Other Ambulatory Visit (INDEPENDENT_AMBULATORY_CARE_PROVIDER_SITE_OTHER): Payer: No Typology Code available for payment source

## 2019-08-22 ENCOUNTER — Other Ambulatory Visit: Payer: Self-pay

## 2019-08-22 DIAGNOSIS — D72829 Elevated white blood cell count, unspecified: Secondary | ICD-10-CM

## 2019-08-22 LAB — CBC WITH DIFFERENTIAL/PLATELET
Basophils Absolute: 0.1 10*3/uL (ref 0.0–0.1)
Basophils Relative: 0.6 % (ref 0.0–3.0)
Eosinophils Absolute: 0.3 10*3/uL (ref 0.0–0.7)
Eosinophils Relative: 2.8 % (ref 0.0–5.0)
HCT: 45.2 % (ref 39.0–52.0)
Hemoglobin: 15.4 g/dL (ref 13.0–17.0)
Lymphocytes Relative: 20.7 % (ref 12.0–46.0)
Lymphs Abs: 2 10*3/uL (ref 0.7–4.0)
MCHC: 34.1 g/dL (ref 30.0–36.0)
MCV: 98.6 fl (ref 78.0–100.0)
Monocytes Absolute: 0.7 10*3/uL (ref 0.1–1.0)
Monocytes Relative: 7.2 % (ref 3.0–12.0)
Neutro Abs: 6.8 10*3/uL (ref 1.4–7.7)
Neutrophils Relative %: 68.7 % (ref 43.0–77.0)
Platelets: 294 10*3/uL (ref 150.0–400.0)
RBC: 4.59 Mil/uL (ref 4.22–5.81)
RDW: 13 % (ref 11.5–15.5)
WBC: 9.8 10*3/uL (ref 4.0–10.5)

## 2019-08-22 MED ORDER — ROSUVASTATIN CALCIUM 20 MG PO TABS: 20.0000 mg | ORAL_TABLET | Freq: Every day | ORAL | 3 refills | Status: DC

## 2019-08-22 NOTE — Addendum Note (Signed)
Addended by: Christiana Fuchs on: 08/22/2019 10:16 AM   Modules accepted: Orders

## 2019-08-25 LAB — CBC WITH DIFFERENTIAL/PLATELET
Absolute Monocytes: 802 cells/uL (ref 200–950)
Basophils Absolute: 50 cells/uL (ref 0–200)
Basophils Relative: 0.5 %
Eosinophils Absolute: 267 cells/uL (ref 15–500)
Eosinophils Relative: 2.7 %
HCT: 45.7 % (ref 38.5–50.0)
Hemoglobin: 15.6 g/dL (ref 13.2–17.1)
Lymphs Abs: 2069 cells/uL (ref 850–3900)
MCH: 32.9 pg (ref 27.0–33.0)
MCHC: 34.1 g/dL (ref 32.0–36.0)
MCV: 96.4 fL (ref 80.0–100.0)
MPV: 10.5 fL (ref 7.5–12.5)
Monocytes Relative: 8.1 %
Neutro Abs: 6712 cells/uL (ref 1500–7800)
Neutrophils Relative %: 67.8 %
Platelets: 308 10*3/uL (ref 140–400)
RBC: 4.74 10*6/uL (ref 4.20–5.80)
RDW: 12.3 % (ref 11.0–15.0)
Total Lymphocyte: 20.9 %
WBC: 9.9 10*3/uL (ref 3.8–10.8)

## 2019-08-25 LAB — PATHOLOGIST SMEAR REVIEW

## 2019-09-01 ENCOUNTER — Telehealth: Payer: Self-pay | Admitting: Family Medicine

## 2019-09-01 ENCOUNTER — Other Ambulatory Visit: Payer: Self-pay

## 2019-09-01 DIAGNOSIS — K625 Hemorrhage of anus and rectum: Secondary | ICD-10-CM

## 2019-09-01 NOTE — Telephone Encounter (Signed)
Called patient referral has been changed and let him know.

## 2019-09-01 NOTE — Telephone Encounter (Signed)
Patient asked if there was any way to switch his Gastro referral form Joshua Manning to [employer], he states the [employer] is not working with him.

## 2019-09-05 ENCOUNTER — Ambulatory Visit: Payer: No Typology Code available for payment source | Admitting: Gastroenterology

## 2019-09-12 LAB — HM COLONOSCOPY

## 2019-09-15 DIAGNOSIS — Z86010 Personal history of colonic polyps: Secondary | ICD-10-CM | POA: Insufficient documentation

## 2019-12-23 ENCOUNTER — Encounter: Payer: Self-pay | Admitting: Family Medicine

## 2019-12-24 ENCOUNTER — Other Ambulatory Visit: Payer: Self-pay

## 2019-12-24 MED ORDER — SILDENAFIL CITRATE 20 MG PO TABS: ORAL_TABLET | ORAL | 0 refills | Status: DC

## 2020-01-10 ENCOUNTER — Other Ambulatory Visit: Payer: Self-pay | Admitting: Family Medicine

## 2020-04-12 ENCOUNTER — Encounter: Payer: Self-pay | Admitting: Physician Assistant

## 2020-04-12 ENCOUNTER — Telehealth (INDEPENDENT_AMBULATORY_CARE_PROVIDER_SITE_OTHER): Payer: No Typology Code available for payment source | Admitting: Physician Assistant

## 2020-04-12 ENCOUNTER — Other Ambulatory Visit: Payer: Self-pay

## 2020-04-12 VITALS — Ht 69.0 in | Wt 187.0 lb

## 2020-04-12 DIAGNOSIS — J019 Acute sinusitis, unspecified: Secondary | ICD-10-CM | POA: Diagnosis not present

## 2020-04-12 MED ORDER — PREDNISONE 20 MG PO TABS: 40.0000 mg | ORAL_TABLET | Freq: Every day | ORAL | 0 refills | Status: DC

## 2020-04-12 MED ORDER — DOXYCYCLINE HYCLATE 100 MG PO TABS: 100.0000 mg | ORAL_TABLET | Freq: Two times a day (BID) | ORAL | 0 refills | Status: DC
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
## 2020-05-14 ENCOUNTER — Other Ambulatory Visit: Payer: Self-pay | Admitting: Family Medicine

## 2020-05-14 MED ORDER — SILDENAFIL CITRATE 20 MG PO TABS: ORAL_TABLET | ORAL | 0 refills | Status: DC

## 2020-05-24 ENCOUNTER — Other Ambulatory Visit: Payer: Self-pay | Admitting: Family Medicine

## 2020-07-04 ENCOUNTER — Other Ambulatory Visit: Payer: Self-pay | Admitting: Family Medicine

## 2020-07-28 ENCOUNTER — Encounter: Payer: Self-pay | Admitting: Family Medicine

## 2020-07-28 ENCOUNTER — Other Ambulatory Visit: Payer: Self-pay

## 2020-07-29 ENCOUNTER — Encounter: Payer: Self-pay | Admitting: Family Medicine

## 2020-07-29 NOTE — Telephone Encounter (Signed)
Patient's insurance has been updated.

## 2020-08-05 NOTE — Patient Instructions (Addendum)
Please stop by lab before you go If you have mychart- we will send your results within 3 business days of [place] receiving them.  If you do not have mychart- we will call you about results within 5 business days of [place] receiving them.  *please also note that you will see labs on mychart as soon as they post. I will later go in and write notes on them- will say "notes from Dr. Yong Channel"  We will call you within two weeks about your referral to lung cancer screening program. If you do not hear within 2 weeks, give [place] a call.   Pretty pretty please quit smoking- one of the best things you can do for your health  Especially if no covid antibodies please consider vaccination  Health Maintenance Due  Topic Date Due  . Hepatitis C Screening Done in office today.  Never done   Recommended follow up: No follow-ups on file.   [email or web address]">  DASH Eating Plan DASH stands for Dietary Approaches to Stop Hypertension. The DASH eating plan is a healthy eating plan that has been shown to:  Reduce high blood pressure (hypertension).  Reduce your risk for type 2 diabetes, heart disease, and stroke.  Help with weight loss. What are tips for following this plan? Reading food labels  Check food labels for the amount of salt (sodium) per serving. Choose foods with less than 5 percent of the Daily Value of sodium. Generally, foods with less than 300 milligrams (mg) of sodium per serving fit into this eating plan.  To find whole grains, look for the word "whole" as the first word in the ingredient list. Shopping  Buy products labeled as "low-sodium" or "no salt added."  Buy fresh foods. Avoid canned foods and pre-made or frozen meals. Cooking  Avoid adding salt when cooking. Use salt-free seasonings or herbs instead of table salt or sea salt. Check with your health care provider or pharmacist before using salt substitutes.  Do not fry foods. Cook foods  using healthy methods such as baking, boiling, grilling, roasting, and broiling instead.  Cook with heart-healthy oils, such as olive, canola, avocado, soybean, or sunflower oil. Meal planning  Eat a balanced diet that includes: ? 4 or more servings of fruits and 4 or more servings of vegetables each day. Try to fill one-half of your plate with fruits and vegetables. ? 6-8 servings of whole grains each day. ? Less than 6 oz (170 g) of lean meat, poultry, or fish each day. A 3-oz (85-g) serving of meat is about the same size as a deck of cards. One egg equals 1 oz (28 g). ? 2-3 servings of low-fat dairy each day. One serving is 1 cup (237 mL). ? 1 serving of nuts, seeds, or beans 5 times each week. ? 2-3 servings of heart-healthy fats. Healthy fats called omega-3 fatty acids are found in foods such as walnuts, flaxseeds, fortified milks, and eggs. These fats are also found in cold-water fish, such as sardines, salmon, and mackerel.  Limit how much you eat of: ? Canned or prepackaged foods. ? Food that is high in trans fat, such as some fried foods. ? Food that is high in saturated fat, such as fatty meat. ? Desserts and other sweets, sugary drinks, and other foods with added sugar. ? Full-fat dairy products.  Do not salt foods before eating.  Do not eat more than 4 egg yolks a week.  Try to eat at least 2 vegetarian meals a  week.  Eat more home-cooked food and less restaurant, buffet, and fast food.   Lifestyle  When eating at a restaurant, ask that your food be prepared with less salt or no salt, if possible.  If you drink alcohol: ? Limit how much you use to:  0-1 drink a day for women who are not pregnant.  0-2 drinks a day for men. ? Be aware of how much alcohol is in your drink. In the [place] one drink equals one 12 oz bottle of beer (355 mL), one 5 oz glass of wine (148 mL), or one 1 oz glass of hard liquor (44 mL). General information  Avoid eating more than 2,300 mg of  salt a day. If you have hypertension, you may need to reduce your sodium intake to 1,500 mg a day.  Work with your health care provider to maintain a healthy body weight or to lose weight. Ask what an ideal weight is for you.  Get at least 30 minutes of exercise that causes your heart to beat faster (aerobic exercise) most days of the week. Activities may include walking, swimming, or biking.  Work with your health care provider or dietitian to adjust your eating plan to your individual calorie needs. What foods should I eat? Fruits All fresh, dried, or frozen fruit. Canned fruit in natural juice (without added sugar). Vegetables Fresh or frozen vegetables (raw, steamed, roasted, or grilled). Low-sodium or reduced-sodium tomato and vegetable juice. Low-sodium or reduced-sodium tomato sauce and tomato paste. Low-sodium or reduced-sodium canned vegetables. Grains Whole-grain or whole-wheat bread. Whole-grain or whole-wheat pasta. Brown rice. [place]. Bulgur. Whole-grain and low-sodium cereals. Pita bread. Low-fat, low-sodium crackers. Whole-wheat flour tortillas. Meats and other proteins Skinless chicken or [place]. Ground chicken or [place]. Pork with fat trimmed off. Fish and seafood. Egg whites. Dried beans, peas, or lentils. Unsalted nuts, nut butters, and seeds. Unsalted canned beans. Lean cuts of beef with fat trimmed off. Low-sodium, lean precooked or cured meat, such as sausages or meat loaves. Dairy Low-fat (1%) or fat-free (skim) milk. Reduced-fat, low-fat, or fat-free cheeses. Nonfat, low-sodium ricotta or cottage cheese. Low-fat or nonfat yogurt. Low-fat, low-sodium cheese. Fats and oils Soft margarine without trans fats. Vegetable oil. Reduced-fat, low-fat, or light mayonnaise and salad dressings (reduced-sodium). Canola, safflower, olive, avocado, soybean, and sunflower oils. Avocado. Seasonings and condiments Herbs. Spices. Seasoning mixes without salt. Other foods Unsalted  popcorn and pretzels. Fat-free sweets. The items listed above may not be a complete list of foods and beverages you can eat. Contact a dietitian for more information. What foods should I avoid? Fruits Canned fruit in a light or heavy syrup. Fried fruit. Fruit in cream or butter sauce. Vegetables Creamed or fried vegetables. Vegetables in a cheese sauce. Regular canned vegetables (not low-sodium or reduced-sodium). Regular canned tomato sauce and paste (not low-sodium or reduced-sodium). Regular tomato and vegetable juice (not low-sodium or reduced-sodium). Angie Fava. Olives. Grains Baked goods made with fat, such as croissants, muffins, or some breads. Dry pasta or rice meal packs. Meats and other proteins Fatty cuts of meat. Ribs. Fried meat. Berniece Salines. Bologna, salami, and other precooked or cured meats, such as sausages or meat loaves. Fat from the back of a pig (fatback). Bratwurst. Salted nuts and seeds. Canned beans with added salt. Canned or smoked fish. Whole eggs or egg yolks. Chicken or [place] with skin. Dairy Whole or 2% milk, cream, and half-and-half. Whole or full-fat cream cheese. Whole-fat or sweetened yogurt. Full-fat cheese. Nondairy creamers. Whipped toppings. Processed  cheese and cheese spreads. Fats and oils Butter. Stick margarine. Lard. Shortening. Ghee. Bacon fat. Tropical oils, such as coconut, palm kernel, or palm oil. Seasonings and condiments Onion salt, garlic salt, seasoned salt, table salt, and sea salt. Worcestershire sauce. Tartar sauce. Barbecue sauce. Teriyaki sauce. Soy sauce, including reduced-sodium. Steak sauce. Canned and packaged gravies. Fish sauce. Oyster sauce. Cocktail sauce. Store-bought horseradish. Ketchup. Mustard. Meat flavorings and tenderizers. Bouillon cubes. Hot sauces. Pre-made or packaged marinades. Pre-made or packaged taco seasonings. Relishes. Regular salad dressings. Other foods Salted popcorn and pretzels. The items listed above may not be a  complete list of foods and beverages you should avoid. Contact a dietitian for more information. Where to find more information  National Heart, Lung, and Blood Institute: https://wilson-eaton.com/  American Heart Association: www.heart.org  Academy of Nutrition and Dietetics: www.eatright.[place]: www.kidney.org Summary  The DASH eating plan is a healthy eating plan that has been shown to reduce high blood pressure (hypertension). It may also reduce your risk for type 2 diabetes, heart disease, and stroke.  When on the DASH eating plan, aim to eat more fresh fruits and vegetables, whole grains, lean proteins, low-fat dairy, and heart-healthy fats.  With the DASH eating plan, you should limit salt (sodium) intake to 2,300 mg a day. If you have hypertension, you may need to reduce your sodium intake to 1,500 mg a day.  Work with your health care provider or dietitian to adjust your eating plan to your individual calorie needs. This information is not intended to replace advice given to you by your health care provider. Make sure you discuss any questions you have with your health care provider. Document Revised: 06/06/2019 Document Reviewed: 06/06/2019 Elsevier Patient Education  2021 [employer].
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
## 2020-08-09 ENCOUNTER — Encounter: Payer: Self-pay | Admitting: Family Medicine

## 2020-08-09 ENCOUNTER — Other Ambulatory Visit: Payer: Self-pay

## 2020-08-09 ENCOUNTER — Ambulatory Visit (INDEPENDENT_AMBULATORY_CARE_PROVIDER_SITE_OTHER): Payer: 59 | Admitting: Family Medicine

## 2020-08-09 VITALS — BP 148/88 | HR 83 | Temp 97.7°F | Ht 69.0 in | Wt 195.4 lb

## 2020-08-09 DIAGNOSIS — I10 Essential (primary) hypertension: Secondary | ICD-10-CM | POA: Diagnosis not present

## 2020-08-09 DIAGNOSIS — Z125 Encounter for screening for malignant neoplasm of prostate: Secondary | ICD-10-CM | POA: Diagnosis not present

## 2020-08-09 DIAGNOSIS — Z0000 Encounter for general adult medical examination without abnormal findings: Secondary | ICD-10-CM

## 2020-08-09 DIAGNOSIS — F17200 Nicotine dependence, unspecified, uncomplicated: Secondary | ICD-10-CM

## 2020-08-09 DIAGNOSIS — Z1159 Encounter for screening for other viral diseases: Secondary | ICD-10-CM

## 2020-08-09 DIAGNOSIS — Z20822 Contact with and (suspected) exposure to covid-19: Secondary | ICD-10-CM

## 2020-08-09 DIAGNOSIS — E785 Hyperlipidemia, unspecified: Secondary | ICD-10-CM

## 2020-08-09 LAB — POC URINALSYSI DIPSTICK (AUTOMATED)
Bilirubin, UA: NEGATIVE
Blood, UA: NEGATIVE
Glucose, UA: NEGATIVE
Ketones, UA: NEGATIVE
Leukocytes, UA: NEGATIVE
Nitrite, UA: NEGATIVE
Protein, UA: NEGATIVE
Spec Grav, UA: 1.015 (ref 1.010–1.025)
Urobilinogen, UA: 0.2 E.U./dL
pH, UA: 6 (ref 5.0–8.0)

## 2020-08-09 NOTE — Addendum Note (Signed)
Addended by: Brandy Hale on: 08/09/2020 04:25 PM   Modules accepted: Orders

## 2020-08-10 LAB — HEPATITIS C ANTIBODY
Hepatitis C Ab: NONREACTIVE
SIGNAL TO CUT-OFF: 0.03 (ref <1.00)

## 2020-08-10 LAB — LDL CHOLESTEROL, DIRECT: Direct LDL: 83 mg/dL

## 2020-08-10 LAB — CBC WITH DIFFERENTIAL/PLATELET
Basophils Absolute: 0.1 10*3/uL (ref 0.0–0.1)
Basophils Relative: 0.9 % (ref 0.0–3.0)
Eosinophils Absolute: 0.3 10*3/uL (ref 0.0–0.7)
Eosinophils Relative: 2.9 % (ref 0.0–5.0)
HCT: 41.1 % (ref 39.0–52.0)
Hemoglobin: 14.4 g/dL (ref 13.0–17.0)
Lymphocytes Relative: 21.5 % (ref 12.0–46.0)
Lymphs Abs: 2.3 10*3/uL (ref 0.7–4.0)
MCHC: 34.9 g/dL (ref 30.0–36.0)
MCV: 95.6 fl (ref 78.0–100.0)
Monocytes Absolute: 0.8 10*3/uL (ref 0.1–1.0)
Monocytes Relative: 7.3 % (ref 3.0–12.0)
Neutro Abs: 7.3 10*3/uL (ref 1.4–7.7)
Neutrophils Relative %: 67.4 % (ref 43.0–77.0)
Platelets: 294 10*3/uL (ref 150.0–400.0)
RBC: 4.3 Mil/uL (ref 4.22–5.81)
RDW: 12.7 % (ref 11.5–15.5)
WBC: 10.8 10*3/uL — ABNORMAL HIGH (ref 4.0–10.5)

## 2020-08-10 LAB — COMPREHENSIVE METABOLIC PANEL
ALT: 38 U/L (ref 0–53)
AST: 21 U/L (ref 0–37)
Albumin: 4.7 g/dL (ref 3.5–5.2)
Alkaline Phosphatase: 48 U/L (ref 39–117)
BUN: 6 mg/dL (ref 6–23)
CO2: 29 mEq/L (ref 19–32)
Calcium: 10.1 mg/dL (ref 8.4–10.5)
Chloride: 98 mEq/L (ref 96–112)
Creatinine, Ser: 0.74 mg/dL (ref 0.40–1.50)
GFR: 102.05 mL/min (ref >60.00)
Glucose, Bld: 116 mg/dL — ABNORMAL HIGH (ref 70–99)
Potassium: 3.9 mEq/L (ref 3.5–5.1)
Sodium: 135 mEq/L (ref 135–145)
Total Bilirubin: 0.5 mg/dL (ref 0.2–1.2)
Total Protein: 6.9 g/dL (ref 6.0–8.3)

## 2020-08-10 LAB — LIPID PANEL
Cholesterol: 154 mg/dL (ref 0–200)
HDL: 47.8 mg/dL (ref >39.00)
NonHDL: 106.16
Total CHOL/HDL Ratio: 3
Triglycerides: 215 mg/dL — ABNORMAL HIGH (ref 0.0–149.0)
VLDL: 43 mg/dL — ABNORMAL HIGH (ref 0.0–40.0)

## 2020-08-10 LAB — PSA: PSA: 1.35 ng/mL (ref 0.10–4.00)

## 2020-08-10 LAB — SARS-COV-2 IGG: SARS-COV-2 IgG: 0.02

## 2020-08-12 ENCOUNTER — Other Ambulatory Visit: Payer: Self-pay | Admitting: Family Medicine

## 2020-08-20 ENCOUNTER — Other Ambulatory Visit: Payer: Self-pay | Admitting: Family Medicine

## 2020-08-23 ENCOUNTER — Other Ambulatory Visit: Payer: Self-pay | Admitting: *Deleted

## 2020-08-23 DIAGNOSIS — F17210 Nicotine dependence, cigarettes, uncomplicated: Secondary | ICD-10-CM

## 2020-09-15 ENCOUNTER — Inpatient Hospital Stay: Admission: RE | Admit: 2020-09-15 | Payer: 59 | Source: Ambulatory Visit

## 2020-09-15 ENCOUNTER — Encounter: Payer: 59 | Admitting: Acute Care

## 2020-09-15 NOTE — Progress Notes (Deleted)
Shared Decision Making Visit Lung Cancer Screening Program [phone])   Eligibility:  Age 55 y.o.  Pack Years Smoking History Calculation 57 pack year smoking history (# packs/per year x # years smoked)  Recent History of coughing up blood  no  Unexplained weight loss? no ( >Than 15 pounds within the last 6 months )  Prior History Lung / other cancer no (Diagnosis within the last 5 years already requiring surveillance chest CT Scans).  Smoking Status Current Smoker  Former Smokers: Years since quit: NA  Quit Date: NA  Visit Components:  Discussion included one or more decision making aids. yes  Discussion included risk/benefits of screening. yes  Discussion included potential follow up diagnostic testing for abnormal scans. yes  Discussion included meaning and risk of over diagnosis. yes  Discussion included meaning and risk of False Positives. yes  Discussion included meaning of total radiation exposure. yes  Counseling Included:  Importance of adherence to annual lung cancer LDCT screening. yes  Impact of comorbidities on ability to participate in the program. yes  Ability and willingness to under diagnostic treatment. yes  Smoking Cessation Counseling:  Current Smokers:   Discussed importance of smoking cessation. yes  Information about tobacco cessation classes and interventions provided to patient. yes  Patient provided with "ticket" for LDCT Scan. yes  Symptomatic Patient. no  Counseling  Diagnosis Code: Tobacco Use Z72.0  Asymptomatic Patient yes  Counseling (Intermediate counseling: > three minutes counseling) B1478  Former Smokers:   Discussed the importance of maintaining cigarette abstinence. yes  Diagnosis Code: Personal History of Nicotine Dependence. G95.621  Information about tobacco cessation classes and interventions provided to patient. Yes  Patient provided with "ticket" for LDCT Scan. yes  Written Order for Lung Cancer  Screening with LDCT placed in Epic. Yes (CT Chest Lung Cancer Screening Low Dose W/O CM) [id number] Z12.2-Screening of respiratory organs Z87.891-Personal history of nicotine dependence  There were no vitals taken for this visit.  I have spent 25 minutes of face to face time with Mr. Jeziorski discussing the risks and benefits of lung cancer screening. We viewed a power point together that explained in detail the above noted topics. We paused at intervals to allow for questions to be asked and answered to ensure understanding.We discussed that the single most powerful action that he can take to decrease his risk of developing lung cancer is to quit smoking. We discussed whether or not he is ready to commit to setting a quit date. We discussed options for tools to aid in quitting smoking including nicotine replacement therapy, non-nicotine medications, support groups, Quit Smart classes, and behavior modification. We discussed that often times setting smaller, more achievable goals, such as eliminating 1 cigarette a day for a week and then 2 cigarettes a day for a week can be helpful in slowly decreasing the number of cigarettes smoked. This allows for a sense of accomplishment as well as providing a clinical benefit. I gave him the " Be Stronger Than Your Excuses" card with contact information for community resources, classes, free nicotine replacement therapy, and access to mobile apps, text messaging, and on-line smoking cessation help. I have also given him my card and contact information in the event he needs to contact me. We discussed the time and location of the scan, and that either Doroteo Glassman RN or I will call with the results within 24-48 hours of receiving them. I have offered him  a copy of the power point we viewed  as  a resource in the event they need reinforcement of the concepts we discussed today in the office. The patient verbalized understanding of all of  the above and had no further  questions upon leaving the office. They have my contact information in the event they have any further questions.  I spent 3 minutes counseling on smoking cessation and the health risks of continued tobacco abuse.  I explained to the patient that there has been a high incidence of coronary artery disease noted on these exams. I explained that this is a non-gated exam therefore degree or severity cannot be determined. This patient is currently on statin therapy. I have asked the patient to follow-up with their PCP regarding any incidental finding of coronary artery disease and management with diet or medication as their PCP  feels is clinically indicated. The patient verbalized understanding of the above and had no further questions upon completion of the visit.      Magdalen Spatz, NP 09/15/2020

## 2020-10-15 ENCOUNTER — Encounter: Payer: Self-pay | Admitting: Family Medicine

## 2020-10-20 ENCOUNTER — Other Ambulatory Visit: Payer: Self-pay

## 2020-10-20 ENCOUNTER — Ambulatory Visit (INDEPENDENT_AMBULATORY_CARE_PROVIDER_SITE_OTHER): Payer: No Typology Code available for payment source | Admitting: Acute Care

## 2020-10-20 ENCOUNTER — Ambulatory Visit
Admission: RE | Admit: 2020-10-20 | Discharge: 2020-10-20 | Disposition: A | Discharge status: Home / Self Care | Payer: No Typology Code available for payment source | Source: Ambulatory Visit | Attending: Acute Care | Admitting: Acute Care

## 2020-10-20 ENCOUNTER — Encounter: Payer: Self-pay | Admitting: Acute Care

## 2020-10-20 VITALS — BP 122/70 | HR 83 | Temp 98.7°F | Ht 69.0 in | Wt 200.2 lb

## 2020-10-20 DIAGNOSIS — F17210 Nicotine dependence, cigarettes, uncomplicated: Secondary | ICD-10-CM

## 2020-10-20 DIAGNOSIS — Z122 Encounter for screening for malignant neoplasm of respiratory organs: Secondary | ICD-10-CM | POA: Diagnosis not present

## 2020-10-20 NOTE — Patient Instructions (Signed)
Thank you for participating in the [facility]. It was our pleasure to meet you today. We will call you with the results of your scan within the next few days. Your scan will be assigned a Lung RADS category score by the physicians reading the scans.  This Lung RADS score determines follow up scanning.  See below for description of categories, and follow up screening recommendations. We will be in touch to schedule your follow up screening annually or based on recommendations of our providers. We will fax a copy of your scan results to your Primary Care Physician, or the physician who referred you to the program, to ensure they have the results. Please call the office if you have any questions or concerns regarding your scanning experience or results.  Our office number is [phone]. Please speak with Denise Phelps, RN. She is our Lung Cancer Screening RN. If she is unavailable when you call, please have the office staff send her a message. She will return your call at her earliest convenience. Remember, if your scan is normal, we will scan you annually as long as you continue to meet the criteria for the program. (Age 55-77, Current smoker or smoker who has quit within the last 15 years). If you are a smoker, remember, quitting is the single most powerful action that you can take to decrease your risk of lung cancer and other pulmonary, breathing related problems. We know quitting is hard, and we are here to help.  Please let us know if there is anything we can do to help you meet your goal of quitting. If you are a former smoker, congratulations. We are proud of you! Remain smoke free! Remember you can refer friends or family members through the number above.  We will screen them to make sure they meet criteria for the program. Thank you for helping us take better care of you by participating in Lung Screening.  Lung RADS Categories:  Lung RADS 1: no nodules  or definitely non-concerning nodules.  Recommendation is for a repeat annual scan in 12 months.  Lung RADS 2:  nodules that are non-concerning in appearance and behavior with a very low likelihood of becoming an active cancer. Recommendation is for a repeat annual scan in 12 months.  Lung RADS 3: nodules that are probably non-concerning , includes nodules with a low likelihood of becoming an active cancer.  Recommendation is for a 6-month repeat screening scan. Often noted after an upper respiratory illness. We will be in touch to make sure you have no questions, and to schedule your 6-month scan.  Lung RADS 4 A: nodules with concerning findings, recommendation is most often for a follow up scan in 3 months or additional testing based on our provider's assessment of the scan. We will be in touch to make sure you have no questions and to schedule the recommended 3 month follow up scan.  Lung RADS 4 B:  indicates findings that are concerning. We will be in touch with you to schedule additional diagnostic testing based on our provider's  assessment of the scan.   

## 2020-10-20 NOTE — Progress Notes (Signed)
Shared Decision Making Visit Lung Cancer Screening Program [phone])   Eligibility:  Age 55 y.o.  Pack Years Smoking History Calculation 57 pack year smoking history (# packs/per year x # years smoked)  Recent History of coughing up blood  no  Unexplained weight loss? no ( >Than 15 pounds within the last 6 months )  Prior History Lung / other cancer no (Diagnosis within the last 5 years already requiring surveillance chest CT Scans).  Smoking Status Current Smoker  Former Smokers: Years since quit: NA  Quit Date: NA  Visit Components:  Discussion included one or more decision making aids. yes  Discussion included risk/benefits of screening. yes  Discussion included potential follow up diagnostic testing for abnormal scans. yes  Discussion included meaning and risk of over diagnosis. yes  Discussion included meaning and risk of False Positives. yes  Discussion included meaning of total radiation exposure. yes  Counseling Included:  Importance of adherence to annual lung cancer LDCT screening. yes  Impact of comorbidities on ability to participate in the program. yes  Ability and willingness to under diagnostic treatment. yes  Smoking Cessation Counseling:  Current Smokers:   Discussed importance of smoking cessation. yes  Information about tobacco cessation classes and interventions provided to patient. yes  Patient provided with "ticket" for LDCT Scan. yes  Symptomatic Patient. no  Counseling NA  Diagnosis Code: Tobacco Use Z72.0  Asymptomatic Patient yes  Counseling (Intermediate counseling: > three minutes counseling) Z3299  Former Smokers:   Discussed the importance of maintaining cigarette abstinence. yes  Diagnosis Code: Personal History of Nicotine Dependence. M42.683  Information about tobacco cessation classes and interventions provided to patient. Yes  Patient provided with "ticket" for LDCT Scan. yes  Written Order for Lung Cancer  Screening with LDCT placed in Epic. Yes (CT Chest Lung Cancer Screening Low Dose W/O CM) [id number] Z12.2-Screening of respiratory organs Z87.891-Personal history of nicotine dependence  I have spent 25 minutes of face to face time with Mr. Riddles discussing the risks and benefits of lung cancer screening. We viewed a power point together that explained in detail the above noted topics. We paused at intervals to allow for questions to be asked and answered to ensure understanding.We discussed that the single most powerful action that he can take to decrease his risk of developing lung cancer is to quit smoking. We discussed whether or not he is ready to commit to setting a quit date. We discussed options for tools to aid in quitting smoking including nicotine replacement therapy, non-nicotine medications, support groups, Quit Smart classes, and behavior modification. We discussed that often times setting smaller, more achievable goals, such as eliminating 1 cigarette a day for a week and then 2 cigarettes a day for a week can be helpful in slowly decreasing the number of cigarettes smoked. This allows for a sense of accomplishment as well as providing a clinical benefit. I gave him the " Be Stronger Than Your Excuses" card with contact information for community resources, classes, free nicotine replacement therapy, and access to mobile apps, text messaging, and on-line smoking cessation help. I have also given him my card and contact information in the event he needs to contact me. We discussed the time and location of the scan, and that either Doroteo Glassman RN or I will call with the results within 24-48 hours of receiving them. I have offered him  a copy of the power point we viewed  as a resource in the event they need reinforcement  of the concepts we discussed today in the office. The patient verbalized understanding of all of  the above and had no further questions upon leaving the office. They have my  contact information in the event they have any further questions.  I spent 3 minutes counseling on smoking cessation and the health risks of continued tobacco abuse.  I explained to the patient that there has been a high incidence of coronary artery disease noted on these exams. I explained that this is a non-gated exam therefore degree or severity cannot be determined. This patient is currently on statin therapy. I have asked the patient to follow-up with their PCP regarding any incidental finding of coronary artery disease and management with diet or medication as their PCP  feels is clinically indicated. The patient verbalized understanding of the above and had no further questions upon completion of the visit.      Magdalen Spatz, NP 10/20/2020

## 2020-10-22 NOTE — Progress Notes (Signed)
Needs to follow up with PCP regarding the CAD, aortic atherosclerosis. I do not see na echo in the system , or a cards note. He is on statin

## 2020-10-22 NOTE — Progress Notes (Signed)
Please call patient and let them  know their  low dose Ct was read as a Lung RADS 2: nodules that are benign in appearance and behavior with a very low likelihood of becoming a clinically active cancer due to size or lack of growth. Recommendation per radiology is for a repeat LDCT in 12 months. .Please let them  know we will order and schedule their  annual screening scan for 10/2020. Please let them  know there was notation of CAD on their  scan.  Please remind the patient  that this is a non-gated exam therefore degree or severity of disease  cannot be determined. Please have them  follow up with their PCP regarding potential risk factor modification, dietary therapy or pharmacologic therapy if clinically indicated. Pt.  is currently on statin therapy. Please place order for annual  screening scan for  10/2020 and fax results to PCP. Thanks so much. 

## 2020-10-25 ENCOUNTER — Other Ambulatory Visit: Payer: Self-pay | Admitting: *Deleted

## 2020-10-25 ENCOUNTER — Encounter: Payer: Self-pay | Admitting: Family Medicine

## 2020-10-25 DIAGNOSIS — J439 Emphysema, unspecified: Secondary | ICD-10-CM | POA: Insufficient documentation

## 2020-10-25 DIAGNOSIS — F17210 Nicotine dependence, cigarettes, uncomplicated: Secondary | ICD-10-CM

## 2020-10-25 DIAGNOSIS — I700 Atherosclerosis of aorta: Secondary | ICD-10-CM | POA: Insufficient documentation

## 2020-11-22 ENCOUNTER — Other Ambulatory Visit: Payer: Self-pay | Admitting: Family Medicine

## 2020-12-05 ENCOUNTER — Other Ambulatory Visit: Payer: Self-pay | Admitting: Family Medicine

## 2020-12-29 ENCOUNTER — Other Ambulatory Visit: Payer: Self-pay

## 2020-12-29 ENCOUNTER — Ambulatory Visit (INDEPENDENT_AMBULATORY_CARE_PROVIDER_SITE_OTHER): Payer: No Typology Code available for payment source | Admitting: Dermatology

## 2020-12-29 ENCOUNTER — Encounter: Payer: Self-pay | Admitting: Dermatology

## 2020-12-29 DIAGNOSIS — L821 Other seborrheic keratosis: Secondary | ICD-10-CM | POA: Diagnosis not present

## 2020-12-29 DIAGNOSIS — Z1283 Encounter for screening for malignant neoplasm of skin: Secondary | ICD-10-CM | POA: Diagnosis not present

## 2020-12-29 DIAGNOSIS — B360 Pityriasis versicolor: Secondary | ICD-10-CM | POA: Diagnosis not present

## 2020-12-29 MED ORDER — FLUCONAZOLE 100 MG PO TABS: ORAL_TABLET | ORAL | 1 refills | Status: DC

## 2021-01-02 ENCOUNTER — Other Ambulatory Visit: Payer: Self-pay | Admitting: Family Medicine

## 2021-01-04 ENCOUNTER — Encounter: Payer: Self-pay | Admitting: Dermatology

## 2021-01-04 NOTE — Progress Notes (Signed)
   Follow-Up Visit   Subjective  Joshua Manning is a 55 y.o. male who presents for the following: Annual Exam (Left post upper arm and left temple crusty lesions).  Annual exam, several areas to check. Location:  Duration:  Quality:  Associated Signs/Symptoms: Modifying Factors:  Severity:  Timing: Context:   Objective  Well appearing patient in no apparent distress; mood and affect are within normal limits. Head to Legs No signs of atypical moles, melanoma or non mole skin cancer.   Torso - Posterior (Back) Extensive areas with subtle branny pink scale; KOH positive  Left Upper Arm - Posterior Textured light brown 6 mm flattopped papule; typical dermoscopy.    A full examination was performed including scalp, head, eyes, ears, nose, lips, neck, chest, axillae, abdomen, back, buttocks, bilateral upper extremities, bilateral lower extremities, hands, feet, fingers, toes, fingernails, and toenails. All findings within normal limits unless otherwise noted below.  Areas beneath undergarments not fully examined.   Assessment & Plan    Skin exam for malignant neoplasm Head to Legs  Yearly skin check  Tinea versicolor Torso - Posterior (Back)  Fluconazole 100 mg, take 2 pills with food every 2 weeks for 3 doses.  Warned of rare possibility of allergy involving liver or skin.  Follow-up by phone or MyChart in 2 months.  Related Medications fluconazole (DIFLUCAN) 100 MG tablet Take 2 pills every 2 weeks x 3 weeks  Seborrheic keratosis Left Upper Arm - Posterior  Offered to take confirmatory biopsy but patient okay with leaving if clinically stable.      I, [name], MD, have reviewed all documentation for this visit.  The documentation on 01/04/21 for the exam, diagnosis, procedures, and orders are all accurate and complete.

## 2021-02-10 ENCOUNTER — Other Ambulatory Visit: Payer: Self-pay

## 2021-02-10 ENCOUNTER — Encounter: Payer: Self-pay | Admitting: Family Medicine

## 2021-02-10 ENCOUNTER — Ambulatory Visit (INDEPENDENT_AMBULATORY_CARE_PROVIDER_SITE_OTHER): Payer: No Typology Code available for payment source | Admitting: Family Medicine

## 2021-02-10 VITALS — BP 122/76 | HR 92 | Temp 98.1°F | Ht 69.0 in | Wt 196.8 lb

## 2021-02-10 DIAGNOSIS — I10 Essential (primary) hypertension: Secondary | ICD-10-CM | POA: Diagnosis not present

## 2021-02-10 DIAGNOSIS — I700 Atherosclerosis of aorta: Secondary | ICD-10-CM

## 2021-02-10 DIAGNOSIS — R739 Hyperglycemia, unspecified: Secondary | ICD-10-CM

## 2021-02-10 DIAGNOSIS — J329 Chronic sinusitis, unspecified: Secondary | ICD-10-CM

## 2021-02-10 DIAGNOSIS — E785 Hyperlipidemia, unspecified: Secondary | ICD-10-CM | POA: Diagnosis not present

## 2021-02-10 DIAGNOSIS — B9689 Other specified bacterial agents as the cause of diseases classified elsewhere: Secondary | ICD-10-CM

## 2021-02-10 LAB — HEMOGLOBIN A1C: Hgb A1c MFr Bld: 5.7 % (ref 4.6–6.5)

## 2021-02-10 LAB — CBC WITH DIFFERENTIAL/PLATELET
Basophils Absolute: 0.1 10*3/uL (ref 0.0–0.1)
Basophils Relative: 0.7 % (ref 0.0–3.0)
Eosinophils Absolute: 0.2 10*3/uL (ref 0.0–0.7)
Eosinophils Relative: 1.8 % (ref 0.0–5.0)
HCT: 41.3 % (ref 39.0–52.0)
Hemoglobin: 14.1 g/dL (ref 13.0–17.0)
Lymphocytes Relative: 15.9 % (ref 12.0–46.0)
Lymphs Abs: 1.8 10*3/uL (ref 0.7–4.0)
MCHC: 34.1 g/dL (ref 30.0–36.0)
MCV: 97.8 fl (ref 78.0–100.0)
Monocytes Absolute: 0.9 10*3/uL (ref 0.1–1.0)
Monocytes Relative: 7.7 % (ref 3.0–12.0)
Neutro Abs: 8.3 10*3/uL — ABNORMAL HIGH (ref 1.4–7.7)
Neutrophils Relative %: 73.9 % (ref 43.0–77.0)
Platelets: 321 10*3/uL (ref 150.0–400.0)
RBC: 4.22 Mil/uL (ref 4.22–5.81)
RDW: 13 % (ref 11.5–15.5)
WBC: 11.3 10*3/uL — ABNORMAL HIGH (ref 4.0–10.5)

## 2021-02-10 LAB — LDL CHOLESTEROL, DIRECT: Direct LDL: 85 mg/dL

## 2021-02-10 MED ORDER — AMOXICILLIN-POT CLAVULANATE 875-125 MG PO TABS: 1.0000 | ORAL_TABLET | Freq: Two times a day (BID) | ORAL | 0 refills | Status: AC
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
## 2021-02-10 NOTE — Patient Instructions (Addendum)
Please stop by lab before you go If you have mychart- we will send your results within 3 business days of [place] receiving them.  If you do not have mychart- we will call you about results within 5 business days of [place] receiving them.  *please also note that you will see labs on mychart as soon as they post. I will later go in and write notes on them- will say "notes from Dr. Yong Channel"  I strongly advise you to quit smoking.  Thank you for participating in a lung cancer screening program!  Please continue to monitor your blood pressure at home - number do look great in office today. Goal <135/85 on home readings  I encourage you to try to incorporate exercise in your lifestyle to also help with you blood pressure and prediabetes.  For your sinus congestion concerning for bacterial sinusitis, please start a 7-day course of Augmentin. Please let us know if symptoms fail to improve, worsen, or reoccur.  Recommended follow up: Return in about 6 months (around 08/13/2021) for physical.

## 2021-04-13 ENCOUNTER — Encounter: Payer: Self-pay | Admitting: Family Medicine

## 2021-04-15 ENCOUNTER — Encounter: Payer: Self-pay | Admitting: Family Medicine

## 2021-04-15 ENCOUNTER — Telehealth (INDEPENDENT_AMBULATORY_CARE_PROVIDER_SITE_OTHER): Payer: No Typology Code available for payment source | Admitting: Family Medicine

## 2021-04-15 ENCOUNTER — Other Ambulatory Visit: Payer: Self-pay

## 2021-04-15 DIAGNOSIS — J019 Acute sinusitis, unspecified: Secondary | ICD-10-CM | POA: Diagnosis not present

## 2021-04-15 DIAGNOSIS — I10 Essential (primary) hypertension: Secondary | ICD-10-CM | POA: Diagnosis not present

## 2021-04-15 MED ORDER — AMOXICILLIN-POT CLAVULANATE 875-125 MG PO TABS: 1.0000 | ORAL_TABLET | Freq: Two times a day (BID) | ORAL | 0 refills | Status: DC
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
## 2021-05-31 ENCOUNTER — Telehealth: Payer: No Typology Code available for payment source | Admitting: Physician Assistant

## 2021-05-31 DIAGNOSIS — J329 Chronic sinusitis, unspecified: Secondary | ICD-10-CM

## 2021-05-31 MED ORDER — PREDNISONE 20 MG PO TABS: 40.0000 mg | ORAL_TABLET | Freq: Every day | ORAL | 0 refills | Status: DC

## 2021-05-31 MED ORDER — DOXYCYCLINE HYCLATE 100 MG PO CAPS: 100.0000 mg | ORAL_CAPSULE | Freq: Two times a day (BID) | ORAL | 0 refills | Status: DC
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
## 2021-05-31 NOTE — Patient Instructions (Signed)
Joshua Manning, thank you for joining Leeanne Rio, PA-C for today's virtual visit.  While this provider is not your primary care provider (PCP), if your PCP is located in our provider database this encounter information will be shared with them immediately following your visit.  Consent: (Patient) Joshua Manning provided verbal consent for this virtual visit at the beginning of the encounter.  Current Medications:  Current Outpatient Medications:    amoxicillin-clavulanate (AUGMENTIN) 875-125 MG tablet, Take 1 tablet by mouth 2 (two) times daily., Disp: 20 tablet, Rfl: 0   Ascorbic Acid (VITAMIN C PO), Take by mouth., Disp: , Rfl:    Multiple Vitamin (MULTIVITAMIN) capsule, Take 1 capsule by mouth daily., Disp: , Rfl:    rosuvastatin (CRESTOR) 20 MG tablet, TAKE ONE TABLET BY MOUTH DAILY, Disp: 90 tablet, Rfl: 3   sildenafil (REVATIO) 20 MG tablet, TAKE TWO TO FIVE TABLETS BY MOUTH DAILY AS NEEDED FOR SEXUAL ACTIVITY, Disp: 50 tablet, Rfl: 0   Medications ordered in this encounter:  No orders of the defined types were placed in this encounter.    *If you need refills on other medications prior to your next appointment, please contact your pharmacy*  Follow-Up: Call back or seek an in-person evaluation if the symptoms worsen or if the condition fails to improve as anticipated.  Other Instructions Please take antibiotic as directed.  Increase fluid intake.  Use Saline nasal spray.  Take a daily multivitamin. Stop the Afrin. Start Flonase instead for the next 10 days. I have sent in a short burst of steroid to help with sinus inflammation and the rebound congestion from Afrin. You can use Claritin-D OTC since BP has been good.  Place a humidifier in the bedroom.   If symptoms are not resolving, or you note any new or worsening symptoms, you will need an in-person assessment.   Sinusitis Sinusitis is redness, soreness, and swelling (inflammation) of the paranasal sinuses.  Paranasal sinuses are air pockets within the bones of your face (beneath the eyes, the middle of the forehead, or above the eyes). In healthy paranasal sinuses, mucus is able to drain out, and air is able to circulate through them by way of your nose. However, when your paranasal sinuses are inflamed, mucus and air can become trapped. This can allow bacteria and other germs to grow and cause infection. Sinusitis can develop quickly and last only a short time (acute) or continue over a long period (chronic). Sinusitis that lasts for more than 12 weeks is considered chronic.  CAUSES  Causes of sinusitis include: Allergies. Structural abnormalities, such as displacement of the cartilage that separates your nostrils (deviated septum), which can decrease the air flow through your nose and sinuses and affect sinus drainage. Functional abnormalities, such as when the small hairs (cilia) that line your sinuses and help remove mucus do not work properly or are not present. SYMPTOMS  Symptoms of acute and chronic sinusitis are the same. The primary symptoms are pain and pressure around the affected sinuses. Other symptoms include: Upper toothache. Earache. Headache. Bad breath. Decreased sense of smell and taste. A cough, which worsens when you are lying flat. Fatigue. Fever. Thick drainage from your nose, which often is green and may contain pus (purulent). Swelling and warmth over the affected sinuses. DIAGNOSIS  Your caregiver will perform a physical exam. During the exam, your caregiver may: Look in your nose for signs of abnormal growths in your nostrils (nasal polyps). Tap over the affected sinus to  check for signs of infection. View the inside of your sinuses (endoscopy) with a special imaging device with a light attached (endoscope), which is inserted into your sinuses. If your caregiver suspects that you have chronic sinusitis, one or more of the following tests may be recommended: Allergy  tests. Nasal culture A sample of mucus is taken from your nose and sent to a lab and screened for bacteria. Nasal cytology A sample of mucus is taken from your nose and examined by your caregiver to determine if your sinusitis is related to an allergy. TREATMENT  Most cases of acute sinusitis are related to a viral infection and will resolve on their own within 10 days. Sometimes medicines are prescribed to help relieve symptoms (pain medicine, decongestants, nasal steroid sprays, or saline sprays).  However, for sinusitis related to a bacterial infection, your caregiver will prescribe antibiotic medicines. These are medicines that will help kill the bacteria causing the infection.  Rarely, sinusitis is caused by a fungal infection. In theses cases, your caregiver will prescribe antifungal medicine. For some cases of chronic sinusitis, surgery is needed. Generally, these are cases in which sinusitis recurs more than 3 times per year, despite other treatments. HOME CARE INSTRUCTIONS  Drink plenty of water. Water helps thin the mucus so your sinuses can drain more easily. Use a humidifier. Inhale steam 3 to 4 times a day (for example, sit in the bathroom with the shower running). Apply a warm, moist washcloth to your face 3 to 4 times a day, or as directed by your caregiver. Use saline nasal sprays to help moisten and clean your sinuses. Take over-the-counter or prescription medicines for pain, discomfort, or fever only as directed by your caregiver. SEEK IMMEDIATE MEDICAL CARE IF: You have increasing pain or severe headaches. You have nausea, vomiting, or drowsiness. You have swelling around your face. You have vision problems. You have a stiff neck. You have difficulty breathing. MAKE SURE YOU:  Understand these instructions. Will watch your condition. Will get help right away if you are not doing well or get worse. Document Released: 07/03/2005 Document Revised: 09/25/2011 Document  Reviewed: 07/18/2011 [facility] Patient Information 2014 [place], [place].    If you have been instructed to have an in-person evaluation today at a local Urgent Care facility, please use the link below. It will take you to a list of all of our available [facility], including address, phone number and hours of operation. Please do not delay care.  [facility]  If you or a family member do not have a primary care provider, use the link below to schedule a visit and establish care. When you choose a [facility] primary care physician or advanced practice provider, you gain a long-term partner in health. Find a Primary Care Provider  Learn more about [facility]'s in-office and virtual care options: [facility]

## 2021-06-10 ENCOUNTER — Other Ambulatory Visit: Payer: Self-pay | Admitting: Family Medicine

## 2021-06-13 MED ORDER — SILDENAFIL CITRATE 20 MG PO TABS: ORAL_TABLET | ORAL | 0 refills | Status: DC

## 2021-06-14 ENCOUNTER — Encounter: Payer: Self-pay | Admitting: Family Medicine

## 2021-06-15 ENCOUNTER — Other Ambulatory Visit: Payer: Self-pay

## 2021-06-15 DIAGNOSIS — R0989 Other specified symptoms and signs involving the circulatory and respiratory systems: Secondary | ICD-10-CM

## 2021-06-15 NOTE — Telephone Encounter (Signed)
Called and spoke with pt and gave below message, xray ordered and pt aware.

## 2021-06-16 ENCOUNTER — Other Ambulatory Visit: Payer: Self-pay

## 2021-06-16 ENCOUNTER — Ambulatory Visit (INDEPENDENT_AMBULATORY_CARE_PROVIDER_SITE_OTHER)
Admission: RE | Admit: 2021-06-16 | Discharge: 2021-06-16 | Disposition: A | Discharge status: Home / Self Care | Payer: No Typology Code available for payment source | Source: Ambulatory Visit | Attending: Family Medicine | Admitting: Family Medicine

## 2021-06-16 DIAGNOSIS — R0989 Other specified symptoms and signs involving the circulatory and respiratory systems: Secondary | ICD-10-CM | POA: Diagnosis not present

## 2021-06-17 ENCOUNTER — Other Ambulatory Visit: Payer: Self-pay | Admitting: Family Medicine

## 2021-06-17 DIAGNOSIS — J329 Chronic sinusitis, unspecified: Secondary | ICD-10-CM

## 2021-06-21 ENCOUNTER — Telehealth: Payer: Self-pay

## 2021-06-21 NOTE — Telephone Encounter (Signed)
Patient is calling in stating he is returning a call from Q about x-ray results.

## 2021-06-22 NOTE — Telephone Encounter (Signed)
Returned pt call and results given.

## 2021-07-07 ENCOUNTER — Telehealth: Payer: Self-pay | Admitting: Family Medicine

## 2021-07-07 NOTE — Telephone Encounter (Signed)
pt called regarding recent ct scan referral sent by dr. hunter please advise

## 2021-08-05 ENCOUNTER — Ambulatory Visit
Admission: RE | Admit: 2021-08-05 | Discharge: 2021-08-05 | Disposition: A | Discharge status: Home / Self Care | Payer: No Typology Code available for payment source | Source: Ambulatory Visit | Attending: Family Medicine | Admitting: Family Medicine

## 2021-08-05 ENCOUNTER — Other Ambulatory Visit: Payer: Self-pay

## 2021-08-05 DIAGNOSIS — J329 Chronic sinusitis, unspecified: Secondary | ICD-10-CM

## 2021-08-07 ENCOUNTER — Other Ambulatory Visit: Payer: Self-pay | Admitting: Family Medicine

## 2021-08-08 ENCOUNTER — Encounter: Payer: Self-pay | Admitting: Family Medicine

## 2021-08-09 ENCOUNTER — Other Ambulatory Visit: Payer: Self-pay | Admitting: *Deleted

## 2021-08-09 ENCOUNTER — Other Ambulatory Visit: Payer: Self-pay | Admitting: Family Medicine

## 2021-08-09 DIAGNOSIS — J329 Chronic sinusitis, unspecified: Secondary | ICD-10-CM

## 2021-08-11 ENCOUNTER — Encounter: Payer: No Typology Code available for payment source | Admitting: Family Medicine

## 2021-08-17 ENCOUNTER — Other Ambulatory Visit: Payer: Self-pay | Admitting: Family Medicine

## 2021-08-18 ENCOUNTER — Encounter: Payer: Self-pay | Admitting: Family Medicine

## 2021-08-18 MED ORDER — SILDENAFIL CITRATE 20 MG PO TABS: ORAL_TABLET | ORAL | 0 refills | Status: DC

## 2021-09-07 ENCOUNTER — Ambulatory Visit: Payer: No Typology Code available for payment source | Admitting: Dermatology

## 2021-11-08 ENCOUNTER — Other Ambulatory Visit: Payer: Self-pay | Admitting: Family Medicine

## 2021-11-10 ENCOUNTER — Telehealth: Payer: Self-pay | Admitting: Acute Care

## 2021-11-10 NOTE — Telephone Encounter (Signed)
Left voicemail with call back number for patient to call to schedule annual LDCT  

## 2021-11-16 ENCOUNTER — Ambulatory Visit (INDEPENDENT_AMBULATORY_CARE_PROVIDER_SITE_OTHER): Payer: No Typology Code available for payment source | Admitting: Dermatology

## 2021-11-16 DIAGNOSIS — Z1283 Encounter for screening for malignant neoplasm of skin: Secondary | ICD-10-CM

## 2021-11-16 NOTE — Telephone Encounter (Signed)
Returned call regarding LDCT scheduling.  Phone was disconnected. Unable to hear anyone speak and unable to leave VM ?

## 2021-12-04 ENCOUNTER — Encounter: Payer: Self-pay | Admitting: Dermatology

## 2021-12-04 NOTE — Progress Notes (Signed)
   Follow-Up Visit   Subjective  Joshua Manning is a 56 y.o. male who presents for the following: Annual Exam (Here for annual skin exam. Concerns back of right leg and left arm. ).  General skin examination, check spots on arm and leg Location:  Duration:  Quality:  Associated Signs/Symptoms: Modifying Factors:  Severity:  Timing: Context:   Objective  Well appearing patient in no apparent distress; mood and affect are within normal limits. General skin examination: No atypical pigmented lesions (normal check with dermoscopy), no nonmelanoma skin cancer.  Likely benign 5 mm slightly textured lesions on arm and leg early seborrheic keratoses and can be left if stable.    A full examination was performed including scalp, head, eyes, ears, nose, lips, neck, chest, axillae, abdomen, back, buttocks, bilateral upper extremities, bilateral lower extremities, hands, feet, fingers, toes, fingernails, and toenails. All findings within normal limits unless otherwise noted below.   Assessment & Plan    Encounter for screening for malignant neoplasm of skin  Annual skin examination.      I, Joshua Monarch, MD, have reviewed all documentation for this visit.  The documentation on 12/04/21 for the exam, diagnosis, procedures, and orders are all accurate and complete.

## 2021-12-16 ENCOUNTER — Other Ambulatory Visit: Payer: Self-pay

## 2021-12-16 DIAGNOSIS — F17210 Nicotine dependence, cigarettes, uncomplicated: Secondary | ICD-10-CM

## 2021-12-16 DIAGNOSIS — Z87891 Personal history of nicotine dependence: Secondary | ICD-10-CM

## 2021-12-16 DIAGNOSIS — Z122 Encounter for screening for malignant neoplasm of respiratory organs: Secondary | ICD-10-CM

## 2022-01-10 ENCOUNTER — Ambulatory Visit
Admission: RE | Admit: 2022-01-10 | Discharge: 2022-01-10 | Disposition: A | Discharge status: Home / Self Care | Payer: No Typology Code available for payment source | Source: Ambulatory Visit | Attending: Family Medicine | Admitting: Family Medicine

## 2022-01-10 DIAGNOSIS — Z122 Encounter for screening for malignant neoplasm of respiratory organs: Secondary | ICD-10-CM

## 2022-01-10 DIAGNOSIS — Z87891 Personal history of nicotine dependence: Secondary | ICD-10-CM

## 2022-01-10 DIAGNOSIS — F17210 Nicotine dependence, cigarettes, uncomplicated: Secondary | ICD-10-CM

## 2022-01-12 ENCOUNTER — Other Ambulatory Visit: Payer: Self-pay

## 2022-01-12 DIAGNOSIS — F17210 Nicotine dependence, cigarettes, uncomplicated: Secondary | ICD-10-CM

## 2022-01-12 DIAGNOSIS — Z122 Encounter for screening for malignant neoplasm of respiratory organs: Secondary | ICD-10-CM

## 2022-01-12 DIAGNOSIS — Z87891 Personal history of nicotine dependence: Secondary | ICD-10-CM

## 2022-01-19 ENCOUNTER — Ambulatory Visit (INDEPENDENT_AMBULATORY_CARE_PROVIDER_SITE_OTHER): Payer: No Typology Code available for payment source | Admitting: Family Medicine

## 2022-01-19 ENCOUNTER — Encounter: Payer: Self-pay | Admitting: Family Medicine

## 2022-01-19 VITALS — BP 130/72 | HR 83 | Temp 98.7°F | Ht 69.0 in | Wt 195.6 lb

## 2022-01-19 DIAGNOSIS — Z125 Encounter for screening for malignant neoplasm of prostate: Secondary | ICD-10-CM

## 2022-01-19 DIAGNOSIS — R739 Hyperglycemia, unspecified: Secondary | ICD-10-CM

## 2022-01-19 DIAGNOSIS — I10 Essential (primary) hypertension: Secondary | ICD-10-CM | POA: Diagnosis not present

## 2022-01-19 DIAGNOSIS — F17200 Nicotine dependence, unspecified, uncomplicated: Secondary | ICD-10-CM | POA: Diagnosis not present

## 2022-01-19 DIAGNOSIS — Z0000 Encounter for general adult medical examination without abnormal findings: Secondary | ICD-10-CM

## 2022-01-19 DIAGNOSIS — J439 Emphysema, unspecified: Secondary | ICD-10-CM

## 2022-01-19 DIAGNOSIS — I700 Atherosclerosis of aorta: Secondary | ICD-10-CM | POA: Diagnosis not present

## 2022-01-19 LAB — POC URINALSYSI DIPSTICK (AUTOMATED)
Bilirubin, UA: NEGATIVE
Blood, UA: NEGATIVE
Glucose, UA: NEGATIVE
Ketones, UA: NEGATIVE
Leukocytes, UA: NEGATIVE
Nitrite, UA: NEGATIVE
Protein, UA: NEGATIVE
Spec Grav, UA: 1.01 (ref 1.010–1.025)
Urobilinogen, UA: 0.2 U/dL
pH, UA: 6 (ref 5.0–8.0)
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
## 2022-01-19 NOTE — Patient Instructions (Addendum)
Health Maintenance Due  Topic Date Due   Zoster Vaccines- Shingrix (1 of 2)- declines for now Never done   Please stop by lab before you go If you have mychart- we will send your results within 3 business days of [place] receiving them.  If you do not have mychart- we will call you about results within 5 business days of [place] receiving them.  *please also note that you will see labs on mychart as soon as they post. I will later go in and write notes on them- will say "notes from Dr. Yong Channel"   Would love once again to see you quit smoking  I agree mild gradual weight loss would be helpful   Recommended follow up: Return in about 6 months (around 07/22/2022) for followup or sooner if needed.Schedule b4 you leave.

## 2022-01-20 LAB — HEMOGLOBIN A1C: Hgb A1c MFr Bld: 5.8 % (ref 4.6–6.5)

## 2022-01-20 LAB — CBC WITH DIFFERENTIAL/PLATELET
Basophils Absolute: 0 10*3/uL (ref 0.0–0.1)
Basophils Relative: 0.3 % (ref 0.0–3.0)
Eosinophils Absolute: 0.3 10*3/uL (ref 0.0–0.7)
Eosinophils Relative: 2.6 % (ref 0.0–5.0)
HCT: 42.2 % (ref 39.0–52.0)
Hemoglobin: 14.7 g/dL (ref 13.0–17.0)
Lymphocytes Relative: 19.3 % (ref 12.0–46.0)
Lymphs Abs: 2.1 10*3/uL (ref 0.7–4.0)
MCHC: 34.9 g/dL (ref 30.0–36.0)
MCV: 98.1 fl (ref 78.0–100.0)
Monocytes Absolute: 1 10*3/uL (ref 0.1–1.0)
Monocytes Relative: 8.8 % (ref 3.0–12.0)
Neutro Abs: 7.5 10*3/uL (ref 1.4–7.7)
Neutrophils Relative %: 69 % (ref 43.0–77.0)
Platelets: 307 10*3/uL (ref 150.0–400.0)
RBC: 4.3 Mil/uL (ref 4.22–5.81)
RDW: 12.8 % (ref 11.5–15.5)
WBC: 10.9 10*3/uL — ABNORMAL HIGH (ref 4.0–10.5)

## 2022-01-20 LAB — COMPREHENSIVE METABOLIC PANEL
ALT: 41 U/L (ref 0–53)
AST: 27 U/L (ref 0–37)
Albumin: 4.8 g/dL (ref 3.5–5.2)
Alkaline Phosphatase: 45 U/L (ref 39–117)
BUN: 6 mg/dL (ref 6–23)
CO2: 26 mEq/L (ref 19–32)
Calcium: 9.8 mg/dL (ref 8.4–10.5)
Chloride: 97 mEq/L (ref 96–112)
Creatinine, Ser: 0.78 mg/dL (ref 0.40–1.50)
GFR: 99.43 mL/min (ref >60.00)
Glucose, Bld: 93 mg/dL (ref 70–99)
Potassium: 4.4 mEq/L (ref 3.5–5.1)
Sodium: 133 mEq/L — ABNORMAL LOW (ref 135–145)
Total Bilirubin: 0.4 mg/dL (ref 0.2–1.2)
Total Protein: 7 g/dL (ref 6.0–8.3)

## 2022-01-20 LAB — PSA: PSA: 1.14 ng/mL (ref 0.10–4.00)

## 2022-01-20 LAB — LIPID PANEL
Cholesterol: 171 mg/dL (ref 0–200)
HDL: 44.2 mg/dL (ref >39.00)
Total CHOL/HDL Ratio: 4
Triglycerides: 487 mg/dL — ABNORMAL HIGH (ref 0.0–149.0)

## 2022-01-20 LAB — LDL CHOLESTEROL, DIRECT: Direct LDL: 89 mg/dL

## 2022-01-28 ENCOUNTER — Other Ambulatory Visit: Payer: Self-pay | Admitting: Family Medicine

## 2022-05-13 ENCOUNTER — Other Ambulatory Visit: Payer: Self-pay | Admitting: Family Medicine

## 2022-07-24 ENCOUNTER — Encounter: Payer: Self-pay | Admitting: Family Medicine

## 2022-07-24 ENCOUNTER — Ambulatory Visit (INDEPENDENT_AMBULATORY_CARE_PROVIDER_SITE_OTHER): Payer: Commercial Managed Care - PPO | Admitting: Family Medicine

## 2022-07-24 VITALS — BP 124/70 | HR 90 | Temp 98.3°F | Ht 69.0 in | Wt 191.0 lb

## 2022-07-24 DIAGNOSIS — J439 Emphysema, unspecified: Secondary | ICD-10-CM

## 2022-07-24 DIAGNOSIS — I10 Essential (primary) hypertension: Secondary | ICD-10-CM

## 2022-07-24 DIAGNOSIS — I700 Atherosclerosis of aorta: Secondary | ICD-10-CM

## 2022-07-24 DIAGNOSIS — E785 Hyperlipidemia, unspecified: Secondary | ICD-10-CM

## 2022-07-24 NOTE — Patient Instructions (Addendum)
For you and your wife- quitting smoking and vaping are 2 of the healthiest decisions you can jointly make together for your long term health!   Love your idea of starting exercise back- goal 150 minutes a week long term.   No changes in meds- lets work on mild weight loss - calorie counting vs. Intermittent fasting vs. Food restriction for higher risk foods.   Recommended follow up: Return in about 6 months (around 01/22/2023) for physical or sooner if needed.Schedule b4 you leave.
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
## 2022-08-02 ENCOUNTER — Other Ambulatory Visit: Payer: Self-pay | Admitting: Family Medicine

## 2022-09-15 LAB — HM COLONOSCOPY

## 2022-09-21 ENCOUNTER — Telehealth: Payer: Self-pay | Admitting: Family Medicine

## 2022-09-21 NOTE — Telephone Encounter (Signed)
Erroneous

## 2022-10-06 ENCOUNTER — Other Ambulatory Visit: Payer: Self-pay | Admitting: Family Medicine

## 2022-11-20 ENCOUNTER — Ambulatory Visit: Payer: No Typology Code available for payment source | Admitting: Dermatology

## 2022-12-12 ENCOUNTER — Other Ambulatory Visit: Payer: Self-pay | Admitting: Acute Care

## 2022-12-12 DIAGNOSIS — Z122 Encounter for screening for malignant neoplasm of respiratory organs: Secondary | ICD-10-CM

## 2022-12-12 DIAGNOSIS — F17210 Nicotine dependence, cigarettes, uncomplicated: Secondary | ICD-10-CM

## 2022-12-12 DIAGNOSIS — Z87891 Personal history of nicotine dependence: Secondary | ICD-10-CM

## 2023-01-09 ENCOUNTER — Encounter: Payer: Self-pay | Admitting: Acute Care

## 2023-01-15 ENCOUNTER — Ambulatory Visit
Admission: RE | Admit: 2023-01-15 | Discharge: 2023-01-15 | Disposition: A | Discharge status: Home / Self Care | Payer: Commercial Managed Care - PPO | Source: Ambulatory Visit | Attending: Acute Care | Admitting: Acute Care

## 2023-01-15 DIAGNOSIS — Z87891 Personal history of nicotine dependence: Secondary | ICD-10-CM

## 2023-01-15 DIAGNOSIS — Z122 Encounter for screening for malignant neoplasm of respiratory organs: Secondary | ICD-10-CM

## 2023-01-15 DIAGNOSIS — F17210 Nicotine dependence, cigarettes, uncomplicated: Secondary | ICD-10-CM

## 2023-01-19 ENCOUNTER — Other Ambulatory Visit: Payer: Self-pay

## 2023-01-19 DIAGNOSIS — F17210 Nicotine dependence, cigarettes, uncomplicated: Secondary | ICD-10-CM

## 2023-01-19 DIAGNOSIS — Z87891 Personal history of nicotine dependence: Secondary | ICD-10-CM

## 2023-01-19 DIAGNOSIS — Z122 Encounter for screening for malignant neoplasm of respiratory organs: Secondary | ICD-10-CM

## 2023-01-30 ENCOUNTER — Encounter: Payer: Commercial Managed Care - PPO | Admitting: Family Medicine

## 2023-05-03 ENCOUNTER — Other Ambulatory Visit: Payer: Self-pay | Admitting: Family Medicine

## 2023-07-13 ENCOUNTER — Encounter: Payer: Commercial Managed Care - PPO | Admitting: Family Medicine

## 2023-07-19 ENCOUNTER — Encounter: Payer: Self-pay | Admitting: Family Medicine

## 2023-07-19 ENCOUNTER — Ambulatory Visit (INDEPENDENT_AMBULATORY_CARE_PROVIDER_SITE_OTHER): Payer: 59 | Admitting: Family Medicine

## 2023-07-19 VITALS — BP 130/72 | HR 81 | Temp 97.2°F | Ht 69.0 in | Wt 194.0 lb

## 2023-07-19 DIAGNOSIS — F17200 Nicotine dependence, unspecified, uncomplicated: Secondary | ICD-10-CM | POA: Diagnosis not present

## 2023-07-19 DIAGNOSIS — Z0000 Encounter for general adult medical examination without abnormal findings: Secondary | ICD-10-CM | POA: Diagnosis not present

## 2023-07-19 DIAGNOSIS — I10 Essential (primary) hypertension: Secondary | ICD-10-CM | POA: Diagnosis not present

## 2023-07-19 DIAGNOSIS — Z131 Encounter for screening for diabetes mellitus: Secondary | ICD-10-CM | POA: Diagnosis not present

## 2023-07-19 DIAGNOSIS — E785 Hyperlipidemia, unspecified: Secondary | ICD-10-CM

## 2023-07-19 DIAGNOSIS — J439 Emphysema, unspecified: Secondary | ICD-10-CM

## 2023-07-19 DIAGNOSIS — I700 Atherosclerosis of aorta: Secondary | ICD-10-CM

## 2023-07-19 DIAGNOSIS — Z125 Encounter for screening for malignant neoplasm of prostate: Secondary | ICD-10-CM

## 2023-07-19 DIAGNOSIS — R739 Hyperglycemia, unspecified: Secondary | ICD-10-CM | POA: Diagnosis not present

## 2023-07-19 LAB — COMPREHENSIVE METABOLIC PANEL
ALT: 44 U/L (ref 0–53)
AST: 28 U/L (ref 0–37)
Albumin: 4.9 g/dL (ref 3.5–5.2)
Alkaline Phosphatase: 86 U/L (ref 39–117)
BUN: 6 mg/dL (ref 6–23)
CO2: 26 meq/L (ref 19–32)
Calcium: 9.6 mg/dL (ref 8.4–10.5)
Chloride: 97 meq/L (ref 96–112)
Creatinine, Ser: 0.71 mg/dL (ref 0.40–1.50)
GFR: 101.22 mL/min (ref >60.00)
Glucose, Bld: 93 mg/dL (ref 70–99)
Potassium: 4.6 meq/L (ref 3.5–5.1)
Sodium: 134 meq/L — ABNORMAL LOW (ref 135–145)
Total Bilirubin: 0.6 mg/dL (ref 0.2–1.2)
Total Protein: 7.3 g/dL (ref 6.0–8.3)

## 2023-07-19 LAB — URINALYSIS, ROUTINE W REFLEX MICROSCOPIC
Bilirubin Urine: NEGATIVE
Hgb urine dipstick: NEGATIVE
Ketones, ur: NEGATIVE
Leukocytes,Ua: NEGATIVE
Nitrite: NEGATIVE
RBC / HPF: NONE SEEN (ref 0–?)
Specific Gravity, Urine: 1.01 (ref 1.000–1.030)
Total Protein, Urine: NEGATIVE
Urine Glucose: NEGATIVE
Urobilinogen, UA: 0.2 (ref 0.0–1.0)
WBC, UA: NONE SEEN (ref 0–?)
pH: 7 (ref 5.0–8.0)

## 2023-07-19 LAB — CBC WITH DIFFERENTIAL/PLATELET
Basophils Absolute: 0.1 10*3/uL (ref 0.0–0.1)
Basophils Relative: 0.4 % (ref 0.0–3.0)
Eosinophils Absolute: 0.3 10*3/uL (ref 0.0–0.7)
Eosinophils Relative: 2.7 % (ref 0.0–5.0)
HCT: 45.7 % (ref 39.0–52.0)
Hemoglobin: 15.6 g/dL (ref 13.0–17.0)
Lymphocytes Relative: 16.1 % (ref 12.0–46.0)
Lymphs Abs: 1.8 10*3/uL (ref 0.7–4.0)
MCHC: 34 g/dL (ref 30.0–36.0)
MCV: 98.3 fL (ref 78.0–100.0)
Monocytes Absolute: 1 10*3/uL (ref 0.1–1.0)
Monocytes Relative: 8.4 % (ref 3.0–12.0)
Neutro Abs: 8.2 10*3/uL — ABNORMAL HIGH (ref 1.4–7.7)
Neutrophils Relative %: 72.4 % (ref 43.0–77.0)
Platelets: 305 10*3/uL (ref 150.0–400.0)
RBC: 4.65 Mil/uL (ref 4.22–5.81)
RDW: 13.2 % (ref 11.5–15.5)
WBC: 11.4 10*3/uL — ABNORMAL HIGH (ref 4.0–10.5)

## 2023-07-19 LAB — LIPID PANEL
Cholesterol: 176 mg/dL (ref 0–200)
HDL: 58.8 mg/dL (ref >39.00)
LDL Cholesterol: 82 mg/dL (ref 0–99)
NonHDL: 116.98
Total CHOL/HDL Ratio: 3
Triglycerides: 177 mg/dL — ABNORMAL HIGH (ref 0.0–149.0)
VLDL: 35.4 mg/dL (ref 0.0–40.0)

## 2023-07-19 LAB — PSA: PSA: 1.32 ng/mL (ref 0.10–4.00)

## 2023-07-19 LAB — HEMOGLOBIN A1C: Hgb A1c MFr Bld: 6 % (ref 4.6–6.5)

## 2023-07-19 NOTE — Patient Instructions (Addendum)
 As always quitting smoking would be the most beneficial next step for your health  Please stop by lab before you go If you have mychart- we will send your results within 3 business days of us  receiving them.  If you do not have mychart- we will call you about results within 5 business days of us  receiving them.  *please also note that you will see labs on mychart as soon as they post. I will later go in and write notes on them- will say notes from Dr. Katrinka   Recommended follow up: Return in about 6 months (around 01/16/2024) for followup or sooner if needed.Schedule b4 you leave.
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
## 2023-08-30 ENCOUNTER — Other Ambulatory Visit: Payer: Self-pay | Admitting: Family Medicine

## 2023-10-02 ENCOUNTER — Ambulatory Visit: Admitting: Family Medicine

## 2023-10-09 ENCOUNTER — Encounter: Payer: Self-pay | Admitting: Family Medicine

## 2023-10-09 ENCOUNTER — Ambulatory Visit (INDEPENDENT_AMBULATORY_CARE_PROVIDER_SITE_OTHER): Admitting: Family Medicine

## 2023-10-09 VITALS — BP 136/74 | HR 88 | Temp 98.4°F | Ht 69.0 in | Wt 196.4 lb

## 2023-10-09 DIAGNOSIS — E785 Hyperlipidemia, unspecified: Secondary | ICD-10-CM

## 2023-10-09 DIAGNOSIS — K4090 Unilateral inguinal hernia, without obstruction or gangrene, not specified as recurrent: Secondary | ICD-10-CM

## 2023-10-09 DIAGNOSIS — R6882 Decreased libido: Secondary | ICD-10-CM | POA: Diagnosis not present

## 2023-10-09 DIAGNOSIS — J439 Emphysema, unspecified: Secondary | ICD-10-CM

## 2023-10-09 DIAGNOSIS — R5383 Other fatigue: Secondary | ICD-10-CM | POA: Diagnosis not present

## 2023-10-09 DIAGNOSIS — N529 Male erectile dysfunction, unspecified: Secondary | ICD-10-CM

## 2023-10-09 DIAGNOSIS — F17200 Nicotine dependence, unspecified, uncomplicated: Secondary | ICD-10-CM

## 2023-10-09 DIAGNOSIS — I10 Essential (primary) hypertension: Secondary | ICD-10-CM

## 2023-10-09 DIAGNOSIS — I700 Atherosclerosis of aorta: Secondary | ICD-10-CM

## 2023-10-09 NOTE — Patient Instructions (Addendum)
 We have placed a referral for you today to [facility] Dr. Derrell Lolling- please call their # if you do not hear within a week (may be listed below or you may see mychart message within a few days with #).  Address: [address], [place], [place] [address] Phone: [phone]  As always encourage you to quit smoking   Schedule a lab visit at the check out desk within 2 weeks BETWEEN 8-9 AM. Return for future fasting labs meaning nothing but water after midnight please. Ok to take your medications with water.    Recommended follow up: Return for next already scheduled visit or sooner if needed.
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
## 2023-10-11 ENCOUNTER — Ambulatory Visit: Admitting: Family Medicine

## 2023-10-11 ENCOUNTER — Other Ambulatory Visit (INDEPENDENT_AMBULATORY_CARE_PROVIDER_SITE_OTHER)

## 2023-10-11 DIAGNOSIS — R5383 Other fatigue: Secondary | ICD-10-CM | POA: Diagnosis not present

## 2023-10-11 DIAGNOSIS — N529 Male erectile dysfunction, unspecified: Secondary | ICD-10-CM | POA: Diagnosis not present

## 2023-10-11 DIAGNOSIS — R6882 Decreased libido: Secondary | ICD-10-CM | POA: Diagnosis not present

## 2023-10-11 LAB — CBC WITH DIFFERENTIAL/PLATELET
Basophils Absolute: 0.1 10*3/uL (ref 0.0–0.1)
Basophils Relative: 0.8 % (ref 0.0–3.0)
Eosinophils Absolute: 0.2 10*3/uL (ref 0.0–0.7)
Eosinophils Relative: 1.8 % (ref 0.0–5.0)
HCT: 41.1 % (ref 39.0–52.0)
Hemoglobin: 14.1 g/dL (ref 13.0–17.0)
Lymphocytes Relative: 16.9 % (ref 12.0–46.0)
Lymphs Abs: 1.6 10*3/uL (ref 0.7–4.0)
MCHC: 34.4 g/dL (ref 30.0–36.0)
MCV: 99.2 fl (ref 78.0–100.0)
Monocytes Absolute: 0.7 10*3/uL (ref 0.1–1.0)
Monocytes Relative: 7.7 % (ref 3.0–12.0)
Neutro Abs: 7 10*3/uL (ref 1.4–7.7)
Neutrophils Relative %: 72.8 % (ref 43.0–77.0)
Platelets: 347 10*3/uL (ref 150.0–400.0)
RBC: 4.15 Mil/uL — ABNORMAL LOW (ref 4.22–5.81)
RDW: 13.9 % (ref 11.5–15.5)
WBC: 9.7 10*3/uL (ref 4.0–10.5)

## 2023-10-11 LAB — COMPREHENSIVE METABOLIC PANEL WITH GFR
ALT: 38 U/L (ref 0–53)
AST: 23 U/L (ref 0–37)
Albumin: 4.6 g/dL (ref 3.5–5.2)
Alkaline Phosphatase: 53 U/L (ref 39–117)
BUN: 7 mg/dL (ref 6–23)
CO2: 28 meq/L (ref 19–32)
Calcium: 9.7 mg/dL (ref 8.4–10.5)
Chloride: 99 meq/L (ref 96–112)
Creatinine, Ser: 0.69 mg/dL (ref 0.40–1.50)
GFR: 101.94 mL/min (ref >60.00)
Glucose, Bld: 85 mg/dL (ref 70–99)
Potassium: 4.4 meq/L (ref 3.5–5.1)
Sodium: 133 meq/L — ABNORMAL LOW (ref 135–145)
Total Bilirubin: 0.5 mg/dL (ref 0.2–1.2)
Total Protein: 6.9 g/dL (ref 6.0–8.3)

## 2023-10-12 ENCOUNTER — Encounter: Payer: Self-pay | Admitting: Family Medicine

## 2023-10-12 LAB — TESTOSTERONE: Testosterone: 397.93 ng/dL (ref 300.00–890.00)

## 2023-10-12 LAB — TSH: TSH: 1.12 u[IU]/mL (ref 0.35–5.50)

## 2023-11-16 IMAGING — CT CT MAXILLOFACIAL W/O CM
1 series · 15 of 30 positions shown, 19 images · non-contrast
Comparison: None.

CLINICAL DATA: Maxillofacial pain. Persistent sinusitis despite
antibiotics.

EXAM:
CT MAXILLOFACIAL WITHOUT CONTRAST
TECHNIQUE: Multidetector CT images of the paranasal sinuses were obtained using
the standard protocol without intravenous contrast.
RADIATION DOSE REDUCTION: This exam was performed according to the
departmental dose-optimization program which includes automated
exposure control, adjustment of the mA and/or kV according to
patient size and/or use of iterative reconstruction technique.

[Series 5: soft tissue · axial · 0.40mm/px · z∈[+801,+931]mm · 15 of 140 slices shown, 19 images]
[im 5/140  brain]
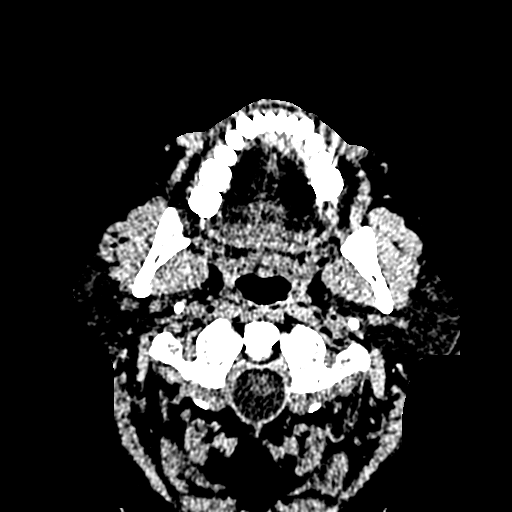
[im 5/140  bone]
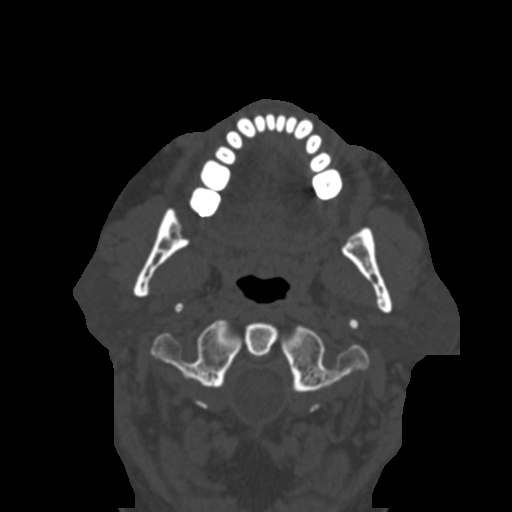
[im 15/140  bone]
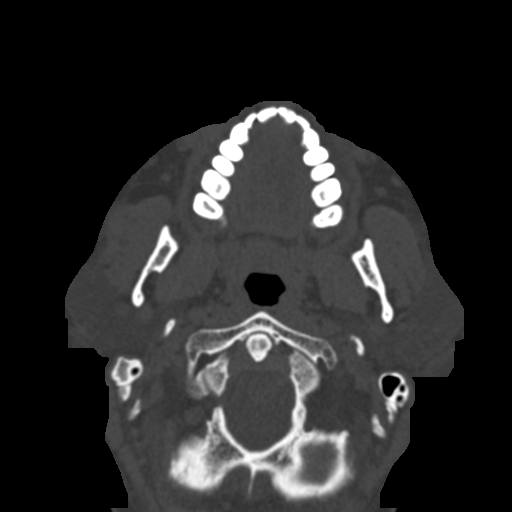
[im 24/140  bone]
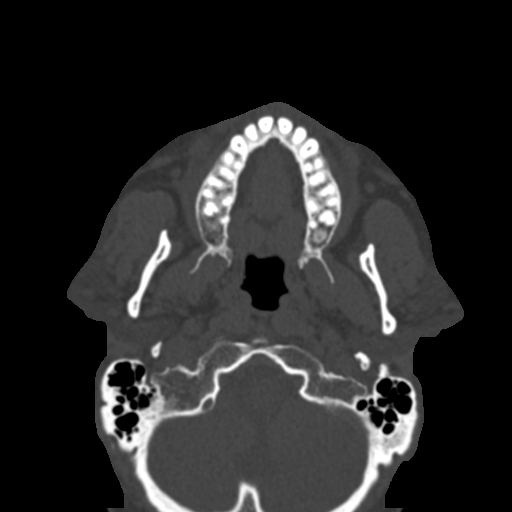
[im 34/140  bone]
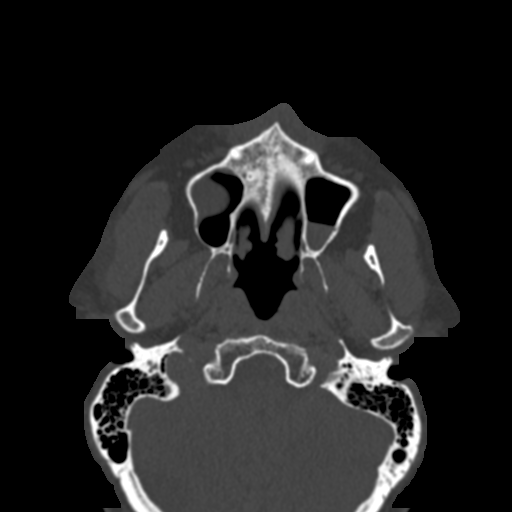
[im 44/140  brain]
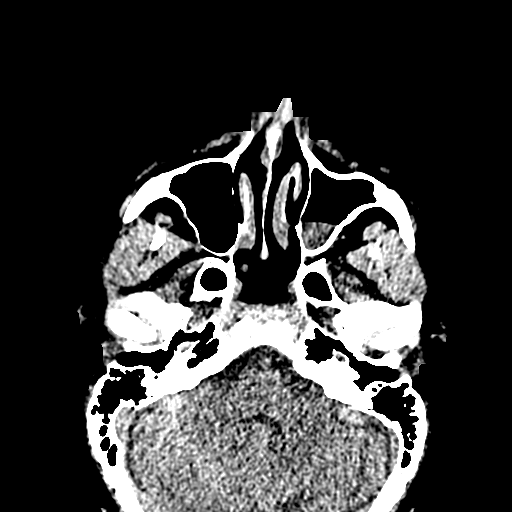
[im 44/140  bone]
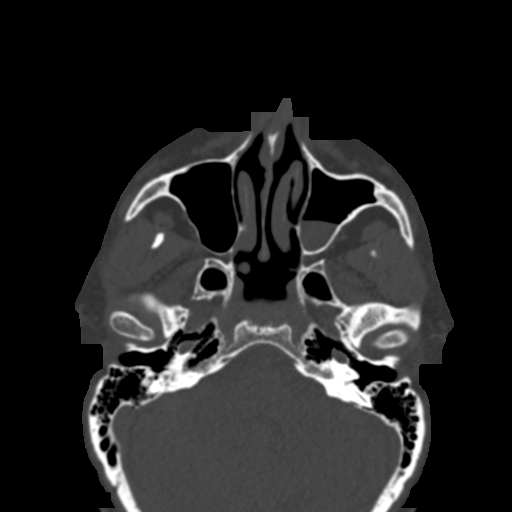
[im 53/140  bone]
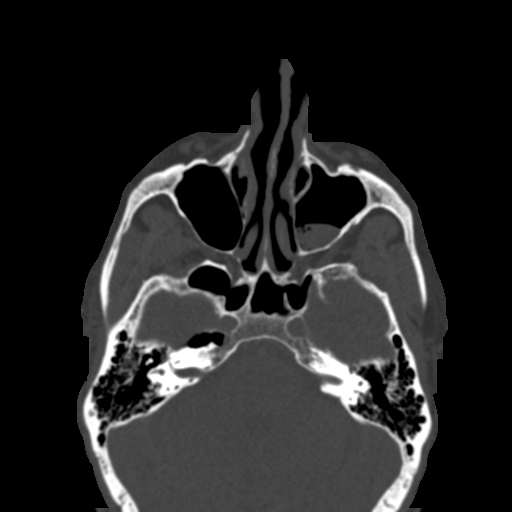
[im 63/140  bone]
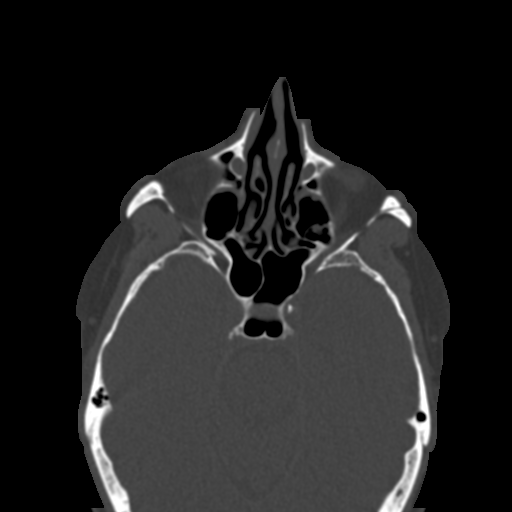
[im 72/140  bone]
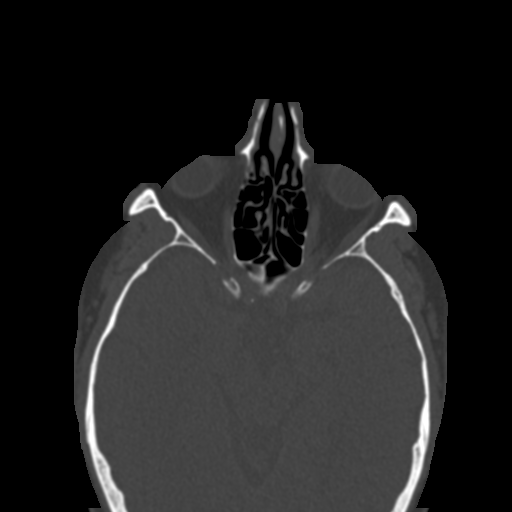
[im 77/140  brain]
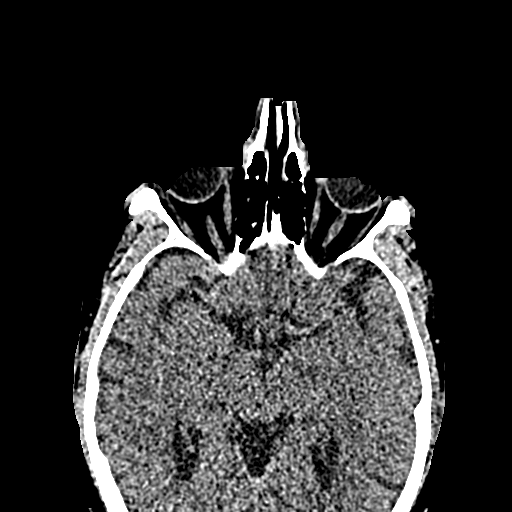
[im 77/140  bone]
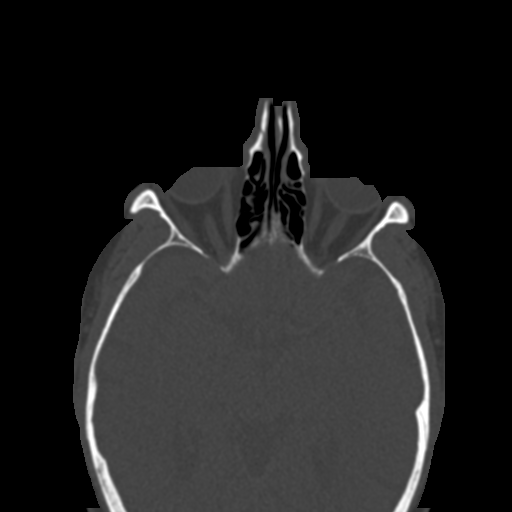
[im 87/140  bone]
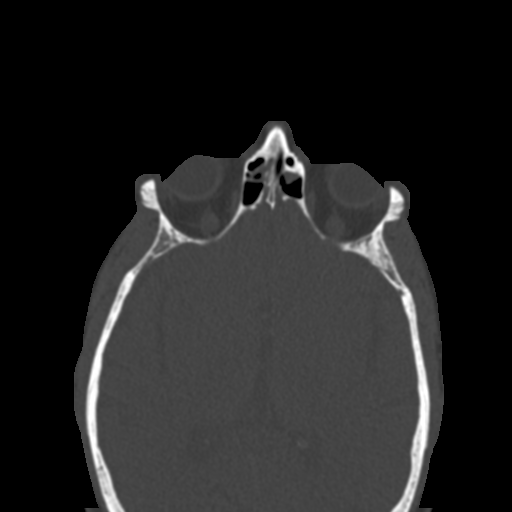
[im 96/140  bone]
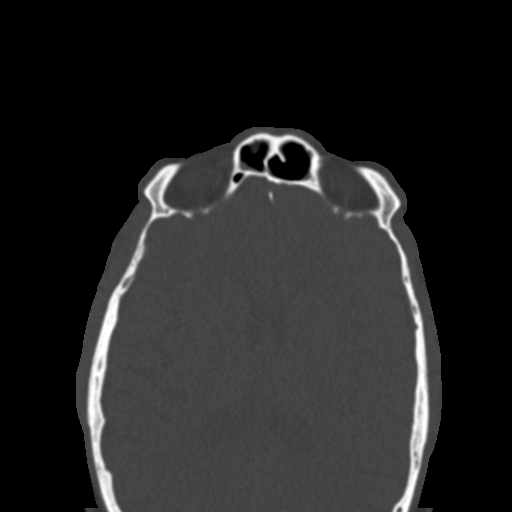
[im 106/140  bone]
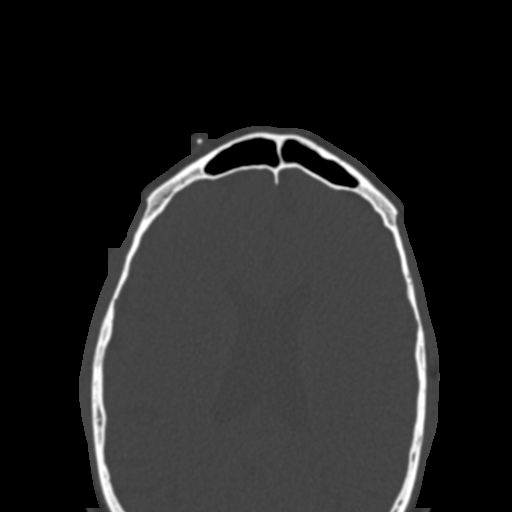
[im 116/140  brain]
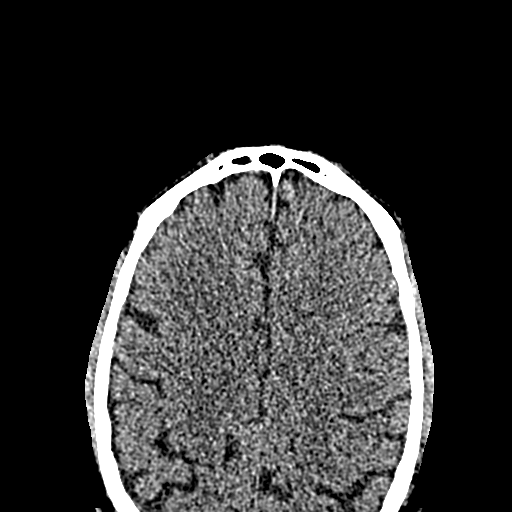
[im 116/140  bone]
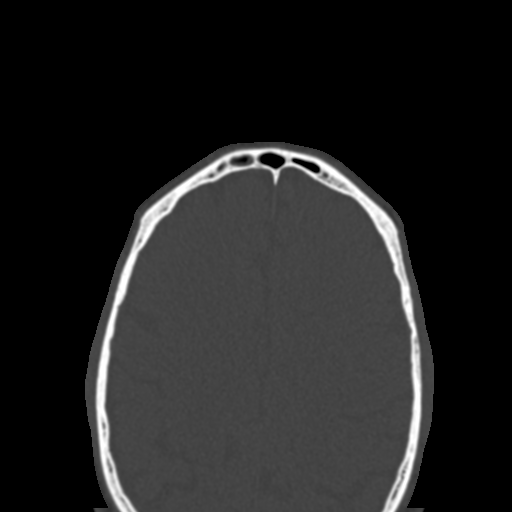
[im 125/140  bone]
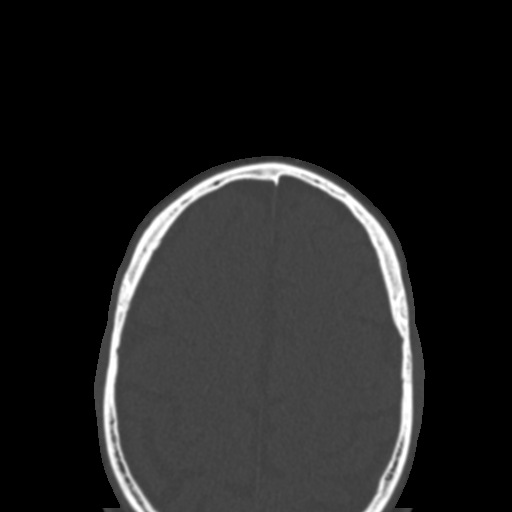
[im 135/140  bone]
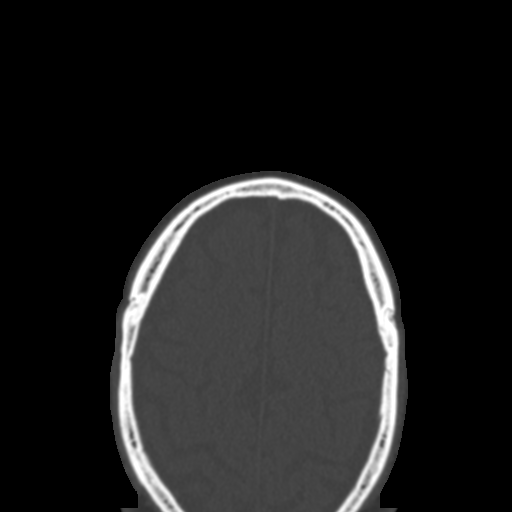

[15 of 30 positions shown; findings below may reference images not displayed]

FINDINGS: Paranasal sinuses:

Frontal: Mild mucosal edema bilaterally.

Ethmoid: Mild mucosal edema bilaterally

Maxillary: Cyst in the base of the right maxillary sinus. Mild
mucosal edema in the roof of the right maxillary sinus.

Air-fluid level with thick secretions in the left maxillary sinus.
Mucosal edema in the ostium of the left maxillary sinus.

Sphenoid: Normally aerated. Patent sphenoethmoidal recesses.

Mastoid sinus and middle ear: Clear bilaterally.

Right ostiomeatal unit: Patent but narrowed due to mucosal edema

Left ostiomeatal unit: Occluded due to mucosal edema.

Nasal passages: Patent. Mild septal deviation to the right

Anatomy: Keros type 2 olfactory recess

No acute skeletal abnormality.

Limited intracranial imaging negative

Negative orbit bilaterally.
IMPRESSION: Mucosal edema in the paranasal sinuses as above. There is occlusion
of the left ostiomeatal complex and thick air-fluid level left
maxillary sinus.

## 2023-11-30 ENCOUNTER — Ambulatory Visit
Admission: RE | Admit: 2023-11-30 | Discharge: 2023-11-30 | Disposition: A | Discharge status: Home / Self Care | Source: Ambulatory Visit | Attending: Family Medicine | Admitting: Family Medicine

## 2023-11-30 DIAGNOSIS — Z87891 Personal history of nicotine dependence: Secondary | ICD-10-CM

## 2023-11-30 DIAGNOSIS — Z122 Encounter for screening for malignant neoplasm of respiratory organs: Secondary | ICD-10-CM

## 2023-11-30 DIAGNOSIS — F17210 Nicotine dependence, cigarettes, uncomplicated: Secondary | ICD-10-CM

## 2023-12-24 ENCOUNTER — Other Ambulatory Visit: Payer: Self-pay | Admitting: Acute Care

## 2023-12-24 DIAGNOSIS — Z122 Encounter for screening for malignant neoplasm of respiratory organs: Secondary | ICD-10-CM

## 2023-12-24 DIAGNOSIS — F17210 Nicotine dependence, cigarettes, uncomplicated: Secondary | ICD-10-CM

## 2023-12-24 DIAGNOSIS — Z87891 Personal history of nicotine dependence: Secondary | ICD-10-CM

## 2023-12-26 ENCOUNTER — Emergency Department (HOSPITAL_COMMUNITY)

## 2023-12-26 ENCOUNTER — Encounter (HOSPITAL_COMMUNITY): Payer: Self-pay

## 2023-12-26 ENCOUNTER — Other Ambulatory Visit: Payer: Self-pay

## 2023-12-26 ENCOUNTER — Emergency Department (HOSPITAL_COMMUNITY)
Admission: EM | Admit: 2023-12-26 | Discharge: 2023-12-26 | Disposition: A | Discharge status: Home / Self Care | Attending: Emergency Medicine | Admitting: Emergency Medicine

## 2023-12-26 DIAGNOSIS — Z720 Tobacco use: Secondary | ICD-10-CM | POA: Insufficient documentation

## 2023-12-26 DIAGNOSIS — R079 Chest pain, unspecified: Secondary | ICD-10-CM | POA: Insufficient documentation

## 2023-12-26 LAB — BASIC METABOLIC PANEL WITH GFR
Anion gap: 13 (ref 5–15)
BUN: 6 mg/dL (ref 6–20)
CO2: 22 mmol/L (ref 22–32)
Calcium: 10.9 mg/dL — ABNORMAL HIGH (ref 8.9–10.3)
Chloride: 94 mmol/L — ABNORMAL LOW (ref 98–111)
Creatinine, Ser: 0.72 mg/dL (ref 0.61–1.24)
GFR, Estimated: >60 mL/min (ref >60)
Glucose, Bld: 102 mg/dL — ABNORMAL HIGH (ref 70–99)
Potassium: 4.4 mmol/L (ref 3.5–5.1)
Sodium: 129 mmol/L — ABNORMAL LOW (ref 135–145)

## 2023-12-26 LAB — CBC
HCT: 41.7 % (ref 39.0–52.0)
Hemoglobin: 14.8 g/dL (ref 13.0–17.0)
MCH: 33.6 pg (ref 26.0–34.0)
MCHC: 35.5 g/dL (ref 30.0–36.0)
MCV: 94.8 fL (ref 80.0–100.0)
Platelets: 341 10*3/uL (ref 150–400)
RBC: 4.4 MIL/uL (ref 4.22–5.81)
RDW: 12.3 % (ref 11.5–15.5)
WBC: 14.5 10*3/uL — ABNORMAL HIGH (ref 4.0–10.5)
nRBC: 0 % (ref 0.0–0.2)

## 2023-12-26 LAB — I-STAT CHEM 8, ED
BUN: 5 mg/dL — ABNORMAL LOW (ref 6–20)
Calcium, Ion: 1.22 mmol/L (ref 1.15–1.40)
Chloride: 97 mmol/L — ABNORMAL LOW (ref 98–111)
Creatinine, Ser: 0.8 mg/dL (ref 0.61–1.24)
Glucose, Bld: 112 mg/dL — ABNORMAL HIGH (ref 70–99)
HCT: 45 % (ref 39.0–52.0)
Hemoglobin: 15.3 g/dL (ref 13.0–17.0)
Potassium: 3.9 mmol/L (ref 3.5–5.1)
Sodium: 130 mmol/L — ABNORMAL LOW (ref 135–145)
TCO2: 23 mmol/L (ref 22–32)

## 2023-12-26 LAB — TROPONIN I (HIGH SENSITIVITY)
Troponin I (High Sensitivity): 16 ng/L (ref <18)
Troponin I (High Sensitivity): 14 ng/L (ref <18)

## 2023-12-26 MED ORDER — MORPHINE SULFATE (PF) 4 MG/ML IV SOLN
4.0000 mg | Freq: Once | INTRAVENOUS | Status: AC
  Administered 2023-12-26: 4 mg via INTRAVENOUS
  Filled 2023-12-26: qty 1

## 2023-12-26 MED ORDER — NITROGLYCERIN 0.4 MG SL SUBL
0.4000 mg | SUBLINGUAL_TABLET | SUBLINGUAL | Status: DC | PRN
  Administered 2023-12-26 (×3): 0.4 mg via SUBLINGUAL
  Filled 2023-12-26: qty 1

## 2023-12-26 MED ORDER — HYDROMORPHONE HCL 1 MG/ML IJ SOLN
0.5000 mg | Freq: Once | INTRAMUSCULAR | Status: AC
  Administered 2023-12-26: 0.5 mg via INTRAVENOUS
  Filled 2023-12-26: qty 1

## 2023-12-26 MED ORDER — IOHEXOL 350 MG/ML SOLN
75.0000 mL | Freq: Once | INTRAVENOUS | Status: AC | PRN
  Administered 2023-12-26: 75 mL via INTRAVENOUS

## 2023-12-26 MED ORDER — SUCRALFATE 1 G PO TABS: 1.0000 g | ORAL_TABLET | Freq: Three times a day (TID) | ORAL | 0 refills | Status: DC

## 2023-12-26 MED ORDER — PANTOPRAZOLE SODIUM 40 MG PO TBEC: 40.0000 mg | DELAYED_RELEASE_TABLET | Freq: Every day | ORAL | 0 refills | Status: DC

## 2023-12-26 MED ORDER — ONDANSETRON HCL 4 MG/2ML IJ SOLN
4.0000 mg | Freq: Once | INTRAMUSCULAR | Status: AC
  Administered 2023-12-26: 4 mg via INTRAVENOUS
  Filled 2023-12-26: qty 2
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
## 2023-12-26 NOTE — Discharge Instructions (Signed)
 Take the prescribed medications as a trial to see if your pain is managed. Follow up with your doctor for recheck in one week.   Return to the ED with any worsening symptoms or new concerns.

## 2023-12-26 NOTE — ED Provider Notes (Signed)
 [place] EMERGENCY DEPARTMENT AT [facility] Provider Note   CSN: [id number] Arrival date & time: 12/26/23  1546     History  Chief Complaint  Patient presents with   Chest Pain    Joshua Manning is a 58 y.o. male.  Patient to ED with sharp, constant, progressively worsening chest pain that started around 6:30 am this morning while sitting and reading the paper. It has become worse, more intense through the day and now radiates to his jaw and causes a headache. He is not SOB, but reports deep breath makes the pain worse. No cough or fever. No history of CAD. He denies radiation to the back but states I feel it deep in the chest. No nausea or vomiting.  The history is provided by the patient. No language interpreter was used.  Chest Pain      Home Medications Prior to Admission medications   Medication Sig Start Date End Date Taking? Authorizing Provider  pantoprazole  (PROTONIX ) 40 MG tablet Take 1 tablet (40 mg total) by mouth daily. 12/26/23  Yes Oleg Oleson, Clovis Dar, PA-C  sucralfate  (CARAFATE ) 1 g tablet Take 1 tablet (1 g total) by mouth 4 (four) times daily -  with meals and at bedtime. 12/26/23  Yes Mandy Second, PA-C  Multiple Vitamin (MULTIVITAMIN) capsule Take 1 capsule by mouth daily.    [provider]  rosuvastatin  (CRESTOR ) 20 MG tablet TAKE 1 TABLET BY MOUTH DAILY 08/30/23   Almira Jaeger, MD  sildenafil  (REVATIO ) 20 MG tablet TAKE 2 TO 5 TABLETS BY MOUTH DAILY AS NEEDED FOR SEXUAL ACTIVITIY 05/03/23   Almira Jaeger, MD      Allergies    Patient has no known allergies.    Review of Systems   Review of Systems  Cardiovascular:  Positive for chest pain.    Physical Exam Updated Vital Signs BP 123/66 (BP Location: Right Arm)   Pulse 79   Temp 98.4 F (36.9 C)   Resp 18   Ht 5' 9 (1.753 m)   Wt 90.7 kg   SpO2 95%   BMI 29.53 kg/m  Physical Exam Vitals and nursing note reviewed.  Constitutional:      Appearance: He is  well-developed. He is not diaphoretic.  Neck:     Vascular: No carotid bruit.  Cardiovascular:     Rate and Rhythm: Normal rate and regular rhythm.  Pulmonary:     Effort: Pulmonary effort is normal.     Breath sounds: Normal breath sounds. No wheezing, rhonchi or rales.  Chest:     Chest wall: Tenderness present.  Musculoskeletal:        General: Normal range of motion.     Cervical back: Normal range of motion and neck supple.  Skin:    General: Skin is warm and dry.  Neurological:     Mental Status: He is alert and oriented to person, place, and time.     ED Results / Procedures / Treatments   Labs (all labs ordered are listed, but only abnormal results are displayed) Labs Reviewed  BASIC METABOLIC PANEL WITH GFR - Abnormal; Notable for the following components:      Result Value   Sodium 129 (*)    Chloride 94 (*)    Glucose, Bld 102 (*)    Calcium  10.9 (*)    All other components within normal limits  CBC - Abnormal; Notable for the following components:   WBC 14.5 (*)    All  other components within normal limits  I-STAT CHEM 8, ED - Abnormal; Notable for the following components:   Sodium 130 (*)    Chloride 97 (*)    BUN 5 (*)    Glucose, Bld 112 (*)    All other components within normal limits  TROPONIN I (HIGH SENSITIVITY)  TROPONIN I (HIGH SENSITIVITY)   Results for orders placed or performed during the hospital encounter of 12/26/23  Basic metabolic panel   Collection Time: 12/26/23  4:07 PM  Result Value Ref Range   Sodium 129 (L) 135 - 145 mmol/L   Potassium 4.4 3.5 - 5.1 mmol/L   Chloride 94 (L) 98 - 111 mmol/L   CO2 22 22 - 32 mmol/L   Glucose, Bld 102 (H) 70 - 99 mg/dL   BUN 6 6 - 20 mg/dL   Creatinine, Ser 1.61 0.61 - 1.24 mg/dL   Calcium  10.9 (H) 8.9 - 10.3 mg/dL   GFR, Estimated >09 >60 mL/min   Anion gap 13 5 - 15  CBC   Collection Time: 12/26/23  4:07 PM  Result Value Ref Range   WBC 14.5 (H) 4.0 - 10.5 K/uL   RBC 4.40 4.22 - 5.81  MIL/uL   Hemoglobin 14.8 13.0 - 17.0 g/dL   HCT 45.4 09.8 - 11.9 %   MCV 94.8 80.0 - 100.0 fL   MCH 33.6 26.0 - 34.0 pg   MCHC 35.5 30.0 - 36.0 g/dL   RDW 14.7 82.9 - 56.2 %   Platelets 341 150 - 400 K/uL   nRBC 0.0 0.0 - 0.2 %  Troponin I (High Sensitivity)   Collection Time: 12/26/23  4:07 PM  Result Value Ref Range   Troponin I (High Sensitivity) 16 <18 ng/L  I-stat chem 8, ED (not at [facility], [facility] or [facility])   Collection Time: 12/26/23  4:31 PM  Result Value Ref Range   Sodium 130 (L) 135 - 145 mmol/L   Potassium 3.9 3.5 - 5.1 mmol/L   Chloride 97 (L) 98 - 111 mmol/L   BUN 5 (L) 6 - 20 mg/dL   Creatinine, Ser 1.30 0.61 - 1.24 mg/dL   Glucose, Bld 865 (H) 70 - 99 mg/dL   Calcium , Ion 1.22 1.15 - 1.40 mmol/L   TCO2 23 22 - 32 mmol/L   Hemoglobin 15.3 13.0 - 17.0 g/dL   HCT 78.4 69.6 - 29.5 %  Troponin I (High Sensitivity)   Collection Time: 12/26/23  6:04 PM  Result Value Ref Range   Troponin I (High Sensitivity) 14 <18 ng/L    EKG EKG Interpretation Date/Time:  Wednesday December 26 2023 15:53:07 EDT Ventricular Rate:  92 PR Interval:  178 QRS Duration:  88 QT Interval:  340 QTC Calculation: 420 R Axis:   -33  Text Interpretation: Normal sinus rhythm Left axis deviation Possible Anterior infarct , age undetermined Abnormal ECG When compared with ECG of 01-Jan-2011 17:17, PREVIOUS ECG IS PRESENT Confirmed by Rafael Bun [phone]) on 12/26/2023 4:34:03 PM  Radiology CT Angio Chest/Abd/Pel for Dissection W and/or W/WO Result Date: 12/26/2023 CLINICAL DATA:  Chest pain EXAM: CT ANGIOGRAPHY CHEST, ABDOMEN AND PELVIS TECHNIQUE: Non-contrast CT of the chest was initially obtained. Multidetector CT imaging through the chest, abdomen and pelvis was performed using the standard protocol during bolus administration of intravenous contrast. Multiplanar reconstructed images and MIPs were obtained and reviewed to evaluate the vascular anatomy. RADIATION DOSE REDUCTION: This exam was performed  according to the departmental dose-optimization program which includes automated exposure  control, adjustment of the mA and/or kV according to patient size and/or use of iterative reconstruction technique. CONTRAST:  75mL OMNIPAQUE  IOHEXOL  350 MG/ML SOLN COMPARISON:  Chest x-ray 12/26/2023, CT chest 11/30/2023 FINDINGS: CTA CHEST FINDINGS Cardiovascular: Non contrasted images of the chest demonstrate no acute intramural hematoma. Left-sided aortic arch with normal three-vessel origin. Mild atherosclerosis. No dissection or aneurysm. Multi vessel coronary vascular calcification. Normal cardiac size. No pericardial effusion Mediastinum/Nodes: Patent trachea. No thyroid  mass. No suspicious lymph nodes. Esophagus is normal Lungs/Pleura: Mild emphysema. No acute airspace disease, pleural effusion or pneumothorax Musculoskeletal: No chest wall abnormality. No acute or significant osseous findings. Review of the MIP images confirms the above findings. CTA ABDOMEN AND PELVIS FINDINGS VASCULAR Aorta: Normal caliber aorta without aneurysm, dissection, vasculitis or significant stenosis. Aortic atherosclerosis Celiac: Patent without evidence of aneurysm, dissection, vasculitis or significant stenosis. SMA: Patent without evidence of aneurysm, dissection, vasculitis or significant stenosis. Renals: Both renal arteries are patent without evidence of aneurysm, dissection, vasculitis, fibromuscular dysplasia or significant stenosis. IMA: Patent without evidence of aneurysm, dissection, vasculitis or significant stenosis. Inflow: Patent without evidence of aneurysm, dissection, vasculitis or significant stenosis. Moderate atherosclerosis Veins: Suboptimally evaluated Review of the MIP images confirms the above findings. NON-VASCULAR Hepatobiliary: No focal liver abnormality is seen. No gallstones, gallbladder wall thickening, or biliary dilatation. Pancreas: Unremarkable. No pancreatic ductal dilatation or surrounding inflammatory  changes. Spleen: Hyperenhancing nodule in the posterior spleen measuring 11 mm series 6, image 100. Adrenals/Urinary Tract: Adrenal glands are unremarkable. Kidneys are normal, without renal calculi, focal lesion, or hydronephrosis. Bladder is unremarkable. Stomach/Bowel: Stomach is within normal limits. No evidence of bowel wall thickening, distention, or inflammatory changes. Lymphatic: No suspicious lymph nodes Reproductive: Negative prostate Other: Negative for pelvic effusion or free air. Small fat containing right inguinal hernia, evidence of prior left groin hernia repair. Small slightly loculated fluid collection in the left inguinal canal. Musculoskeletal: No acute osseous abnormality Review of the MIP images confirms the above findings. IMPRESSION: 1. Negative for acute aortic dissection or aneurysm. 2. No CT evidence for acute intrathoracic, abdominal, or pelvic abnormality. 3. Mild emphysema. 4. 11 mm hyperenhancing nodule in the posterior spleen, nonspecific but likely benign. Follow-up MRI in 6-12 months is suggested. 5. Aortic atherosclerosis. Aortic Atherosclerosis (ICD10-I70.0) and Emphysema (ICD10-J43.9). Electronically Signed   By: Esmeralda Hedge M.D.   On: 12/26/2023 17:51   DG Chest Port 1 View Result Date: 12/26/2023 CLINICAL DATA:  Chest pain. EXAM: PORTABLE CHEST 1 VIEW COMPARISON:  Chest x-ray 06/16/2021. CT chest without contrast 11/30/2023 FINDINGS: Hyperinflation. No consolidation, pneumothorax or effusion. No edema. Normal cardiopericardial silhouette. Overlapping cardiac leads. Fixation hardware along the lower cervical spine at the edge of the imaging field. IMPRESSION: Hyperinflation.  No acute cardiopulmonary disease. Electronically Signed   By: Adrianna Horde M.D.   On: 12/26/2023 16:21    Procedures Procedures    Medications Ordered in ED Medications  nitroGLYCERIN  (NITROSTAT ) SL tablet 0.4 mg (0.4 mg Sublingual Given 12/26/23 1615)  morphine  (PF) 4 MG/ML injection 4 mg (4  mg Intravenous Given 12/26/23 1609)  ondansetron  (ZOFRAN ) injection 4 mg (4 mg Intravenous Given 12/26/23 1609)  morphine  (PF) 4 MG/ML injection 4 mg (4 mg Intravenous Given 12/26/23 1653)  iohexol  (OMNIPAQUE ) 350 MG/ML injection 75 mL (75 mLs Intravenous Contrast Given 12/26/23 1715)  HYDROmorphone  (DILAUDID ) injection 0.5 mg (0.5 mg Intravenous Given 12/26/23 1821)    ED Course/ Medical Decision Making/ A&P  Medical Decision Making This patient presents to the ED for concern of chest pain, this involves an extensive number of treatment options, and is a complaint that carries with it a high risk of complications and morbidity.  The differential diagnosis includes ACS, PNA, PTX, PE, dissection, GERD   Co morbidities that complicate the patient evaluation  HLD, smoker   Additional history obtained:  Additional history and/or information obtained from chart review, notable for no previous CV procedures or cardiology charts   Lab Tests:  I Ordered, and personally interpreted labs.  The pertinent results include:   CBC: WBC 14.5, normal hgb, normal plts Bmet: Na 130, Cl 97 Troponin 16   Imaging Studies ordered:  I ordered imaging studies including CXR I independently visualized and interpreted imaging which showed NAD I agree with the radiologist interpretation  CTA dissection study per radiology:  IMPRESSION: 1. Negative for acute aortic dissection or aneurysm. 2. No CT evidence for acute intrathoracic, abdominal, or pelvic abnormality. 3. Mild emphysema. 4. 11 mm hyperenhancing nodule in the posterior spleen, nonspecific but likely benign. Follow-up MRI in 6-12 months is suggested. 5. Aortic atherosclerosis.      Cardiac Monitoring:  The patient was maintained on a cardiac monitor.  I personally viewed and interpreted the cardiac monitored which showed an underlying rhythm of:  EKG Interpretation Date/Time:  Wednesday December 26 2023  15:53:07 EDT Ventricular Rate:  92 PR Interval:  178 QRS Duration:  88 QT Interval:  340 QTC Calculation: 420 R Axis:   -33  Text Interpretation: Normal sinus rhythm Left axis deviation Possible Anterior infarct , age undetermined Abnormal ECG When compared with ECG of 01-Jan-2011 17:17, PREVIOUS ECG IS PRESENT Confirmed by Rafael Bun [phone]) on 12/26/2023 4:34:03 PM   Medicines ordered and prescription drug management:  I ordered medication including Morphine , Dilaudid   for temporary relief Reevaluation of the patient after these medicines showed that the patient improved I have reviewed the patients home medicines and have made adjustment  NTG x 3 given without change in pain  Test Considered:  CTA dissection study, which was obtained   Critical Interventions:  N/a   Consultations Obtained:  I requested consultation with the n/a,  and discussed lab and imaging findings as well as pertinent plan - they recommend: n/a   Problem List / ED Course:  Here with sharp chest pain radiating to neck/jaw since this morning No history of ACS VSS, EKG nonacute Troponin 16 Dissection study obtained and is negative for acute dissection Patient presentation reviewed with Dr. Ranelle Buys, ED attending. He can be discharged home.  Will trial Protonix , Carafate . Urged to recheck with his PCP in one week.  Return precautions discussed.    Reevaluation:  After the interventions noted above, I reevaluated the patient and found that they have :improved   Social Determinants of Health:  Current smoker   Disposition:  After consideration of the diagnostic results and the patients response to treatment, I feel that the patient would benefit from discharge home.   Risk Prescription drug management.           Final Clinical Impression(s) / ED Diagnoses Final diagnoses:  Nonspecific chest pain    Rx / DC Orders ED Discharge Orders          Ordered    pantoprazole   (PROTONIX ) 40 MG tablet  Daily        12/26/23 1911    sucralfate  (CARAFATE ) 1 g tablet  3 times daily with meals & bedtime  12/26/23 1911              Mandy Second, PA-C 12/26/23 1913    Sallyanne Creamer, DO 12/28/23 1139

## 2023-12-26 NOTE — ED Triage Notes (Signed)
 Pt having CP that started at 6:30 am/ right shoulder pain/ bilateral jaw pain. Denies n/v. Pt states he feels lightheaded.

## 2024-01-01 ENCOUNTER — Ambulatory Visit: Admitting: Family Medicine

## 2024-01-01 ENCOUNTER — Encounter: Payer: Self-pay | Admitting: Family Medicine

## 2024-01-01 VITALS — BP 144/72 | HR 81 | Temp 98.1°F | Ht 69.0 in | Wt 196.6 lb

## 2024-01-01 DIAGNOSIS — R0789 Other chest pain: Secondary | ICD-10-CM

## 2024-01-01 DIAGNOSIS — R0600 Dyspnea, unspecified: Secondary | ICD-10-CM | POA: Diagnosis not present

## 2024-01-01 LAB — COMPREHENSIVE METABOLIC PANEL WITH GFR
AG Ratio: 1.9 (calc) (ref 1.0–2.5)
ALT: 34 U/L (ref 9–46)
AST: 22 U/L (ref 10–35)
Albumin: 4.4 g/dL (ref 3.6–5.1)
Alkaline phosphatase (APISO): 50 U/L (ref 35–144)
BUN: 9 mg/dL (ref 7–25)
CO2: 25 mmol/L (ref 20–32)
Calcium: 9.6 mg/dL (ref 8.6–10.3)
Chloride: 102 mmol/L (ref 98–110)
Creat: 0.92 mg/dL (ref 0.70–1.30)
Globulin: 2.3 g/dL (ref 1.9–3.7)
Glucose, Bld: 88 mg/dL (ref 65–99)
Potassium: 4.2 mmol/L (ref 3.5–5.3)
Sodium: 135 mmol/L (ref 135–146)
Total Bilirubin: 0.3 mg/dL (ref 0.2–1.2)
Total Protein: 6.7 g/dL (ref 6.1–8.1)
eGFR: 96 mL/min/{1.73_m2} (ref >=60)

## 2024-01-01 LAB — CBC WITH DIFFERENTIAL/PLATELET
Absolute Lymphocytes: 2365 {cells}/uL (ref 850–3900)
Absolute Monocytes: 1112 {cells}/uL — ABNORMAL HIGH (ref 200–950)
Basophils Absolute: 65 {cells}/uL (ref 0–200)
Basophils Relative: 0.6 %
Eosinophils Absolute: 378 {cells}/uL (ref 15–500)
Eosinophils Relative: 3.5 %
HCT: 40.7 % (ref 38.5–50.0)
Hemoglobin: 13.8 g/dL (ref 13.2–17.1)
MCH: 33.1 pg — ABNORMAL HIGH (ref 27.0–33.0)
MCHC: 33.9 g/dL (ref 32.0–36.0)
MCV: 97.6 fL (ref 80.0–100.0)
MPV: 10.1 fL (ref 7.5–12.5)
Monocytes Relative: 10.3 %
Neutro Abs: 6880 {cells}/uL (ref 1500–7800)
Neutrophils Relative %: 63.7 %
Platelets: 325 10*3/uL (ref 140–400)
RBC: 4.17 10*6/uL — ABNORMAL LOW (ref 4.20–5.80)
RDW: 12 % (ref 11.0–15.0)
Total Lymphocyte: 21.9 %
WBC: 10.8 10*3/uL (ref 3.8–10.8)

## 2024-01-01 MED ORDER — VALSARTAN 80 MG PO TABS: 80.0000 mg | ORAL_TABLET | Freq: Every day | ORAL | 5 refills | Status: DC

## 2024-01-01 NOTE — Patient Instructions (Addendum)
 If you have worsening pain pattern seek care immediately or other new symptoms such as worsening shortness of breath   One of biggest things you can do to lower your cardiac risk is quit smoking! Strongly encourage  We have placed a referral for you today to cardiology and for echocardiogram- please call their # if you do not hear within a week (may be listed below or you may see mychart message within a few days with #).   Start valsrtan 80 mg and follow up in one month  Please stop by lab before you go If you have mychart- we will send your results within 3 business days of us  receiving them.  If you do not have mychart- we will call you about results within 5 business days of us  receiving them.  *please also note that you will see labs on mychart as soon as they post. I will later go in and write notes on them- will say notes from Dr. Arlene Ben   Recommended follow up: Return for next already scheduled visit or sooner if needed.
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
## 2024-01-02 ENCOUNTER — Ambulatory Visit: Payer: Self-pay | Admitting: Family Medicine

## 2024-01-07 NOTE — Progress Notes (Addendum)
 Cardiology Office Note   Date:  01/21/2024  ID:  Asar, Evilsizer 08/04/1965, MRN [id number] PCP: Katrinka Garnette KIDD, MD  [facility] Providers Cardiologist:  None   History of Present Illness Joshua Manning is a 58 y.o. male with past medical history of hypertension, aortic atherosclerosis, emphysema, tobacco use disorder, hyperlipidemia.  Presents today for evaluation of chest pain.  Patient was seen in the ED on 6/11 for evaluation of chest pain.  Pain was worse with deep inhalation. EKG showed normal sinus rhythm with heart rate 92 bpm, left axis deviation, possible anterior infarct, heart rate 92 bpm. Chest x-ray showed hyperinflation, no acute cardiopulmonary disease.  High-sensitivity troponin 16,14.  K4.4, creatinine 0.72, WBC 14.5, hemoglobin 14.8.  CTA showed no aortic dissection or aneurysm, no PE.  Patient was discharged from the ED with instructions to follow-up with PCP.  He saw his PCP on 6/17.  At that time, patient had some improvement in symptoms with Protonix .  Also had some chest wall pain so could be possible costochondritis.  With patient's cardiac risk factors including smoking history, hyperlipidemia, aortic atherosclerosis, hypertension, he was referred to cardiology. An echocardiogram was ordered by has not been completed   Today patient reports that since being seen in the ER on 6/11, he has continued to have episodes of chest pain.  He has also had shortness of breath on exertion.  He has of both sharp chest pain and chest tightness, chest pain occasionally radiates to his back. Sometimes these symptoms occur together, but they can also occur separately.  Chest pain seems to happen more when he is at rest.  Episodes last around 30 minutes - 1 hour before resolving on their own.  Patient has also been having shortness of breath on exertion.  Denies orthopnea, lower extremity swelling.  Denies dizziness, syncope, near syncope.  Occasionally he has brief episodes of  palpitations.  When he was seen in the ER on 6/11, he noted that his blood pressure was very elevated.  In addition his chest pain he also had headache and jaw pain at that time.  He has since had a few recurrences of headache and right jaw pain.  When this occurs, he checks his blood pressure.  His blood pressure has predominantly been in the 140s/80s.  At times can get into the 150s systolic.   Patient denies family history of cardiac problems.  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 His PCP ordered an echocardiogram which is scheduled for 7/9   Studies Reviewed  Risk Assessment/Calculations   HYPERTENSION CONTROL Vitals:   01/21/24 1331 01/21/24 1412  BP: (!) 148/86 (!) 140/78    The patient's blood pressure is elevated above target today.  In order to address the patient's elevated BP: A new medication was prescribed today.          Physical Exam VS:  BP (!) 140/78   Pulse 79   Ht 5' 9 (1.753 m)   Wt 192 lb 11.2 oz (87.4 kg)   SpO2 94%   BMI 28.46 kg/m        Wt Readings from Last 3 Encounters:  01/21/24 192 lb 11.2 oz (87.4 kg)  01/01/24 196 lb 9.6 oz (89.2 kg)  12/26/23 200 lb (90.7 kg)    GEN: Well nourished, well developed in no acute distress. Sitting comfortably in the chair  NECK: No JVD  CARDIAC:  RRR,  no murmurs, rubs, gallops. Radial pulses 2+ bilaterally  RESPIRATORY:  Clear to auscultation without rales, wheezing or rhonchi. Normal WOB on room air   ABDOMEN: Soft, non-tender, non-distended EXTREMITIES:  No edema in BLE; No deformity   ASSESSMENT AND PLAN  Chest pain  DOE  - Patient was seen in the ED on 6/11 with chest pain that radiated to his jaw and caused headache.  Chest pain also radiated to his back.  High-sensitivity troponin 16, 14. EKG without ischemic changes.  CTA chest/ab/pelvis showed no evidence of aortic dissection or aneurysm, no acute intrathoracic abdominal or pelvic  abnormality. No PE seen (though imaging was focused on aneurysm rule out).  Did note multivessel coronary vascular calcification. - Patient reports that he continues to have chest discomfort and DOE.  Chest pain seems to occur when he was at rest.  Is not exertional. Can occur when he is emotionally stressed. Episodes occur randomly and resolve on their own after 30 minutes or so  - Patient was seen by his PCP in follow-up.  An echocardiogram was ordered and is scheduled for 7/9. - Ordered coronary CTA  - Ordered BMP prior to scan  - BP has continued to be elevated at times. Possible this is contributing to chest discomfort. Start amlodipine  5 mg daily for BP control and antianginal benefit   HTN  - Patient reports that when he was seen in the ED 6/11, his BP was very elevated up to 190s systolic. BP has improved, but continues to be elevated at times. Reports BP predominantly in the 140s/80s, but can get into the 150s systolic  - Start amlodipine  5 mg daily  - Continue valsartan  80 mg daily  - BMP ordered as above   Palpitations - Patient occasionally has brief episodes of palpitations. They are rather mild.  - Offered cardiac monitor but we agreed to hold off for now  Tobacco use  - Counseled on tobacco cessation and provided resources/information on AVS    HLD  - LDL 82 in 07/2023 - Continue crestor  20 mg daily for now. Pending coronary CTA results, may increase in the future   ADDENDUM 7/23- Coronary CTA completed, FFR was positive for a flow limiting lesion in the proximal RCA. Arranged cath for further evaluation. See phone note 7/23 for full consent   Dispo: Follow up in 3 months with Dr. Elmira   Signed, Rollo FABIENE Louder, PA-C

## 2024-01-16 ENCOUNTER — Ambulatory Visit: Payer: 59 | Admitting: Family Medicine

## 2024-01-21 ENCOUNTER — Ambulatory Visit: Attending: Cardiology | Admitting: Cardiology

## 2024-01-21 ENCOUNTER — Encounter: Payer: Self-pay | Admitting: Cardiology

## 2024-01-21 VITALS — BP 140/78 | HR 79 | Ht 69.0 in | Wt 192.7 lb

## 2024-01-21 DIAGNOSIS — F17200 Nicotine dependence, unspecified, uncomplicated: Secondary | ICD-10-CM | POA: Diagnosis not present

## 2024-01-21 DIAGNOSIS — I10 Essential (primary) hypertension: Secondary | ICD-10-CM

## 2024-01-21 DIAGNOSIS — R0789 Other chest pain: Secondary | ICD-10-CM | POA: Diagnosis not present

## 2024-01-21 DIAGNOSIS — R079 Chest pain, unspecified: Secondary | ICD-10-CM

## 2024-01-21 MED ORDER — AMLODIPINE BESYLATE 5 MG PO TABS: 5.0000 mg | ORAL_TABLET | Freq: Every day | ORAL | 0 refills | Status: DC

## 2024-01-21 MED ORDER — METOPROLOL TARTRATE 100 MG PO TABS: ORAL_TABLET | ORAL | 0 refills | Status: DC

## 2024-01-21 NOTE — Patient Instructions (Addendum)
 Medication Instructions:  Take the Metoprolol  100mg  tablet 2 hours before the coronary CT  Begin Amlodipine  5mg . Take one tablet daily.  *If you need a refill on your cardiac medications before your next appointment, please call your pharmacy*   Lab Work: Labs will be drawn today............ BMET If you have labs (blood work) drawn today and your tests are completely normal, you will receive your results only by: MyChart Message (if you have MyChart) OR A paper copy in the mail If you have any lab test that is abnormal or we need to change your treatment, we will call you to review the results.   Testing/Procedures:   Your cardiac CT will be scheduled at one of the below locations:   [facility] [address] [place], [place] [address] [phone]  OR  [facility] [address] [place], [place] [address] [phone]  OR   [facility] [address] [place], [place] [address] [phone]  OR   [facility] [address] [place], [place] [address] [phone]  OR   [facility] [address]  [place], [place] [address]  If scheduled at [facility], please arrive at the [facility] Entrance (Entrance C2) of [facility] 30 minutes prior to test start time. You can use the FREE valet parking offered at entrance C (encouraged to control the heart rate for the test)  Proceed to the [facility] Radiology Department (first floor) to check-in and test prep.   All radiology patients and guests should use entrance C2 at [facility], accessed from [facility], even though the hospital's physical address listed is [address].    If scheduled at the Heart and Vascular Tower at [address], please enter the parking lot using the [address] entrance and use the FREE valet  service at the patient drop-off area. Enter the buidling and check-in with registration on the main floor.  If scheduled at [facility] or [facility], please arrive 15 mins early for check-in and test prep.  There is spacious parking and easy access to the radiology department from the [facility] entrance. Please enter here and check-in with the desk attendant.   If scheduled at [facility], please arrive 30 minutes early for check-in and test prep.  Please follow these instructions carefully (unless otherwise directed):  An IV will be required for this test and Nitroglycerin  will be given.  Hold all erectile dysfunction medications at least 3 days (72 hrs) prior to test. (Ie viagra , cialis , sildenafil , tadalafil , etc)   On the Night Before the Test: Be sure to Drink plenty of water. Do not consume any caffeinated/decaffeinated beverages or chocolate 12 hours prior to your test. Do not take any antihistamines 12 hours prior to your test. If the patient has contrast allergy: Patient will need a prescription for Prednisone  and very clear instructions (as follows): Prednisone  50 mg - take 13 hours prior to test Take another Prednisone  50 mg 7 hours prior to test Take another Prednisone  50 mg 1 hour prior to test Take Benadryl 50 mg 1 hour prior to test Patient must complete all four doses of above prophylactic medications. Patient will need a ride after test due to Benadryl.  On the Day of the Test: Drink plenty of water until 1 hour prior to the test.  Do not eat any food 1 hour prior to test. You may take your regular medications prior to the test.  Take metoprolol  (Lopressor ) two hours prior to test. If you take Furosemide/Hydrochlorothiazide/Spironolactone/Chlorthalidone, please HOLD on the morning of the test. Patients who wear a continuous glucose monitor MUST remove the device prior to scanning. FEMALES- please  wear underwire-free bra if available, avoid dresses & tight clothing  *For Clinical Staff only. Please instruct patient the following:* Heart Rate Medication Recommendations for Cardiac CT  Resting HR < 50 bpm  No medication  Resting HR 50-60 bpm and BP >110/50 mmHG   Consider Metoprolol  tartrate 25 mg PO 90-120 min prior to scan  Resting HR 60-65 bpm and BP >110/50 mmHG  Metoprolol  tartrate 50 mg PO 90-120 minutes prior to scan   Resting HR > 65 bpm and BP >110/50 mmHG  Metoprolol  tartrate 100 mg PO 90-120 minutes prior to scan  Consider Ivabradine 10-15 mg PO or a calcium  channel blocker for resting HR >60 bpm and contraindication to metoprolol  tartrate  Consider Ivabradine 10-15 mg PO in combination with metoprolol  tartrate for HR >80 bpm        After the Test: Drink plenty of water. After receiving IV contrast, you may experience a mild flushed feeling. This is normal. On occasion, you may experience a mild rash up to 24 hours after the test. This is not dangerous. If this occurs, you can take Benadryl 25 mg, Zyrtec, Claritin, or Allegra and increase your fluid intake. (Patients taking Tikosyn should avoid Benadryl, and may take Zyrtec, Claritin, or Allegra) If you experience trouble breathing, this can be serious. If it is severe call 911 IMMEDIATELY. If it is mild, please call our office.  We will call to schedule your test 2-4 weeks out understanding that some insurance companies will need an authorization prior to the service being performed.   For more information and frequently asked questions, please visit our website : [email or web address]  For non-scheduling related questions, please contact the cardiac imaging nurse navigator should you have any questions/concerns: Cardiac Imaging Nurse Navigators Direct Office Dial: [phone]   For scheduling needs, including cancellations and rescheduling, please call [place], [phone].    Follow-Up: At [facility], you and your health needs are our priority.  As part of our continuing mission to provide you with exceptional heart care, we have created designated Provider Care Teams.  These Care Teams include your primary Cardiologist (physician) and Advanced Practice Providers (APPs -  Physician Assistants and Nurse Practitioners) who all work together to provide you with the care you need, when you need it.  We recommend signing up for the patient portal called MyChart.  Sign up information is provided on this After Visit Summary.  MyChart is used to connect with patients for Virtual Visits (Telemedicine).  Patients are able to view lab/test results, encounter notes, upcoming appointments, etc.  Non-urgent messages can be sent to your provider as well.   To learn more about what you can do with MyChart, go to [email or web address].    Your next appointment:   3 month(s)  Provider:   Newman Lawrence, MD or Rollo Louder, PA    Other Instructions Thank you for choosing [facility]!

## 2024-01-22 ENCOUNTER — Ambulatory Visit: Payer: Self-pay | Admitting: Cardiology

## 2024-01-22 LAB — BASIC METABOLIC PANEL WITH GFR
BUN/Creatinine Ratio: 6 — ABNORMAL LOW (ref 9–20)
BUN: 4 mg/dL — ABNORMAL LOW (ref 6–24)
CO2: 21 mmol/L (ref 20–29)
Calcium: 9.5 mg/dL (ref 8.7–10.2)
Chloride: 94 mmol/L — ABNORMAL LOW (ref 96–106)
Creatinine, Ser: 0.67 mg/dL — ABNORMAL LOW (ref 0.76–1.27)
Glucose: 94 mg/dL (ref 70–99)
Potassium: 4.8 mmol/L (ref 3.5–5.2)
Sodium: 131 mmol/L — ABNORMAL LOW (ref 134–144)
eGFR: 108 mL/min/1.73 (ref >59)

## 2024-01-22 NOTE — Telephone Encounter (Signed)
 Called patient advised of below they verbalized understanding.

## 2024-01-22 NOTE — Telephone Encounter (Signed)
-----   Message from Rollo FABIENE Louder sent at 01/22/2024  8:07 AM EDT ----- Please tell patient that his creatinine and BUN were both low. This is possibly due to overhydration. His sodium is also a bit low, which seems to be a chronic issue for him. Again, this could be  related to overhydration, but I recommend he talks to his primary care provider about this further. He has appointment with his PCP on 7/15   OK to proceed with coronary CTA  ----- Message ----- From: Interface, [facility] Lab Results In Sent: 01/22/2024  12:35 AM EDT To: Rollo JONELLE Louder, PA-C

## 2024-01-23 ENCOUNTER — Ambulatory Visit (HOSPITAL_COMMUNITY)
Admission: RE | Admit: 2024-01-23 | Discharge: 2024-01-23 | Disposition: A | Discharge status: Home / Self Care | Source: Ambulatory Visit | Attending: Family Medicine | Admitting: Family Medicine

## 2024-01-23 DIAGNOSIS — R0600 Dyspnea, unspecified: Secondary | ICD-10-CM | POA: Insufficient documentation

## 2024-01-23 DIAGNOSIS — R0789 Other chest pain: Secondary | ICD-10-CM | POA: Insufficient documentation

## 2024-01-23 LAB — ECHOCARDIOGRAM COMPLETE
Area-P 1/2: 3.46 cm2
S' Lateral: 2.9 cm

## 2024-01-29 ENCOUNTER — Ambulatory Visit: Admitting: Family Medicine

## 2024-01-29 VITALS — BP 120/70 | HR 87 | Temp 98.2°F | Ht 69.0 in | Wt 194.2 lb

## 2024-01-29 DIAGNOSIS — I10 Essential (primary) hypertension: Secondary | ICD-10-CM

## 2024-01-29 DIAGNOSIS — E785 Hyperlipidemia, unspecified: Secondary | ICD-10-CM | POA: Diagnosis not present

## 2024-01-29 DIAGNOSIS — R0789 Other chest pain: Secondary | ICD-10-CM | POA: Diagnosis not present

## 2024-01-29 NOTE — Patient Instructions (Addendum)
 Spleen MRI next visit  Hope you get some answers from cardiology- with shortness of breath we also considered pulmonary consult but will wait on results of testing- call cardiology if you don't hear by next week  We are happy to see you sooner with new or worsening symptoms or failure to improve  Recommended follow up: Return in about 6 months (around 07/31/2024) for physical or sooner if needed.Schedule b4 you leave.
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
## 2024-01-31 ENCOUNTER — Encounter (HOSPITAL_COMMUNITY): Payer: Self-pay

## 2024-02-04 ENCOUNTER — Ambulatory Visit (HOSPITAL_COMMUNITY)
Admission: RE | Admit: 2024-02-04 | Discharge: 2024-02-04 | Disposition: A | Discharge status: Home / Self Care | Source: Ambulatory Visit | Attending: Cardiology | Admitting: Cardiology

## 2024-02-04 DIAGNOSIS — I2510 Atherosclerotic heart disease of native coronary artery without angina pectoris: Secondary | ICD-10-CM | POA: Diagnosis not present

## 2024-02-04 DIAGNOSIS — R079 Chest pain, unspecified: Secondary | ICD-10-CM | POA: Diagnosis present

## 2024-02-04 DIAGNOSIS — I2584 Coronary atherosclerosis due to calcified coronary lesion: Secondary | ICD-10-CM | POA: Insufficient documentation

## 2024-02-04 DIAGNOSIS — J439 Emphysema, unspecified: Secondary | ICD-10-CM | POA: Insufficient documentation

## 2024-02-04 MED ORDER — IOHEXOL 350 MG/ML SOLN
100.0000 mL | Freq: Once | INTRAVENOUS | Status: AC | PRN
  Administered 2024-02-04: 100 mL via INTRAVENOUS

## 2024-02-04 MED ORDER — NITROGLYCERIN 0.4 MG SL SUBL
0.8000 mg | SUBLINGUAL_TABLET | Freq: Once | SUBLINGUAL | Status: AC
  Administered 2024-02-04: 0.8 mg via SUBLINGUAL

## 2024-02-05 ENCOUNTER — Other Ambulatory Visit: Payer: Self-pay | Admitting: Cardiology

## 2024-02-05 ENCOUNTER — Ambulatory Visit (HOSPITAL_BASED_OUTPATIENT_CLINIC_OR_DEPARTMENT_OTHER)
Admission: RE | Admit: 2024-02-05 | Discharge: 2024-02-05 | Disposition: A | Discharge status: Home / Self Care | Source: Ambulatory Visit | Attending: Cardiology | Admitting: Cardiology

## 2024-02-05 DIAGNOSIS — R931 Abnormal findings on diagnostic imaging of heart and coronary circulation: Secondary | ICD-10-CM | POA: Diagnosis not present

## 2024-02-05 DIAGNOSIS — I2510 Atherosclerotic heart disease of native coronary artery without angina pectoris: Secondary | ICD-10-CM

## 2024-02-06 ENCOUNTER — Ambulatory Visit: Payer: Self-pay | Admitting: Cardiology

## 2024-02-06 ENCOUNTER — Telehealth: Payer: Self-pay | Admitting: Cardiology

## 2024-02-06 DIAGNOSIS — Z01818 Encounter for other preprocedural examination: Secondary | ICD-10-CM

## 2024-02-06 DIAGNOSIS — I2510 Atherosclerotic heart disease of native coronary artery without angina pectoris: Secondary | ICD-10-CM

## 2024-02-06 NOTE — Telephone Encounter (Signed)
 Called Cath lab, Got patient scheduled for 9:00 am LHC with Dr. Elmira on the 29th, Patient will have to present to hospital at 7:00 am.  Tried to call patient to give instructions for the [facility], Left message for patient to call back and sending MyChart message as well

## 2024-02-06 NOTE — Addendum Note (Signed)
 Addended by: BYRON LEONTINE RAMAN on: 02/06/2024 04:56 PM   Modules accepted: Orders

## 2024-02-06 NOTE — Telephone Encounter (Signed)
 Patient's recent coronary CTA from 7/22 showed a coronary calcium  score of 2191 (99th percentile). There was severe atherosclerosis: 70-99% prox RCA, 50-69% prox/mid RCA, 50-69% ostial LCx, 25-49% diffuse LAD. Study was sent for [facility] which suggested a flow limiting lesion in the proximal RCA   Discussed these findings with Dr. Elmira who recommended cardiac catheterization next week.   I called patient to discuss. Patient reports that his chest pain has been stable since being seen in the office earlier this month. Denies worsening symptoms. Discussed risks and benefits of cath and patient is in agreement with proceeding. He will need BMP and CBC prior to cath, patient understands that he will need these drawn this week   Informed Consent   Shared Decision Making/Informed Consent The risks [stroke (1 in 1000), death (1 in 1000), kidney failure [usually temporary] (1 in 500), bleeding (1 in 200), allergic reaction [possibly serious] (1 in 200)], benefits (diagnostic support and management of coronary artery disease) and alternatives of a cardiac catheterization were discussed in detail with Joshua Manning and he is willing to proceed.     Rollo FABIENE Louder, PA-C 02/06/2024 8:39 AM

## 2024-02-09 LAB — CBC
Hematocrit: 42.4 % (ref 37.5–51.0)
Hemoglobin: 14.3 g/dL (ref 13.0–17.7)
MCH: 32.8 pg (ref 26.6–33.0)
MCHC: 33.7 g/dL (ref 31.5–35.7)
MCV: 97 fL (ref 79–97)
Platelets: 339 x10E3/uL (ref 150–450)
RBC: 4.36 x10E6/uL (ref 4.14–5.80)
RDW: 12.9 % (ref 11.6–15.4)
WBC: 11.9 x10E3/uL — ABNORMAL HIGH (ref 3.4–10.8)

## 2024-02-09 LAB — BASIC METABOLIC PANEL WITH GFR
BUN/Creatinine Ratio: 8 — ABNORMAL LOW (ref 9–20)
BUN: 6 mg/dL (ref 6–24)
CO2: 20 mmol/L (ref 20–29)
Calcium: 9.8 mg/dL (ref 8.7–10.2)
Chloride: 95 mmol/L — ABNORMAL LOW (ref 96–106)
Creatinine, Ser: 0.73 mg/dL — ABNORMAL LOW (ref 0.76–1.27)
Glucose: 90 mg/dL (ref 70–99)
Potassium: 4.6 mmol/L (ref 3.5–5.2)
Sodium: 132 mmol/L — ABNORMAL LOW (ref 134–144)
eGFR: 105 mL/min/1.73 (ref >59)

## 2024-02-11 ENCOUNTER — Telehealth: Payer: Self-pay | Admitting: *Deleted

## 2024-02-11 ENCOUNTER — Ambulatory Visit: Payer: Self-pay | Admitting: Cardiology

## 2024-02-11 NOTE — Telephone Encounter (Signed)
 Cardiac Catheterization scheduled at [facility] for: Tuesday February 12, 2024 9 AM Arrival time [facility] Main Entrance A at: 7 AM  Nothing to eat after midnight prior to procedure, clear liquids until 5 AM day of procedure.  Medication instructions: -Usual morning medications can be taken with sips of water  including aspirin  81 mg.  Plan to go home the same day, you will only stay overnight if medically necessary.  You must have responsible adult to drive you home.  Someone must be with you the first 24 hours after you arrive home.  Reviewed procedure instructions with patient.

## 2024-02-12 ENCOUNTER — Encounter (HOSPITAL_COMMUNITY)
Admission: RE | Disposition: A | Discharge status: Home / Self Care | Payer: Self-pay | Source: Home / Self Care | Attending: Cardiology

## 2024-02-12 ENCOUNTER — Ambulatory Visit (HOSPITAL_COMMUNITY)
Admission: RE | Admit: 2024-02-12 | Discharge: 2024-02-12 | Disposition: A | Discharge status: Home / Self Care | Attending: Cardiology | Admitting: Cardiology

## 2024-02-12 ENCOUNTER — Other Ambulatory Visit: Payer: Self-pay

## 2024-02-12 DIAGNOSIS — I700 Atherosclerosis of aorta: Secondary | ICD-10-CM | POA: Insufficient documentation

## 2024-02-12 DIAGNOSIS — I2584 Coronary atherosclerosis due to calcified coronary lesion: Secondary | ICD-10-CM | POA: Insufficient documentation

## 2024-02-12 DIAGNOSIS — R002 Palpitations: Secondary | ICD-10-CM | POA: Insufficient documentation

## 2024-02-12 DIAGNOSIS — E785 Hyperlipidemia, unspecified: Secondary | ICD-10-CM | POA: Diagnosis not present

## 2024-02-12 DIAGNOSIS — J439 Emphysema, unspecified: Secondary | ICD-10-CM | POA: Insufficient documentation

## 2024-02-12 DIAGNOSIS — Z79899 Other long term (current) drug therapy: Secondary | ICD-10-CM | POA: Insufficient documentation

## 2024-02-12 DIAGNOSIS — F17200 Nicotine dependence, unspecified, uncomplicated: Secondary | ICD-10-CM | POA: Insufficient documentation

## 2024-02-12 DIAGNOSIS — I10 Essential (primary) hypertension: Secondary | ICD-10-CM | POA: Insufficient documentation

## 2024-02-12 DIAGNOSIS — Z006 Encounter for examination for normal comparison and control in clinical research program: Secondary | ICD-10-CM

## 2024-02-12 DIAGNOSIS — I25118 Atherosclerotic heart disease of native coronary artery with other forms of angina pectoris: Secondary | ICD-10-CM | POA: Diagnosis not present

## 2024-02-12 HISTORY — PX: LEFT HEART CATH AND CORONARY ANGIOGRAPHY: CATH118249

## 2024-02-12 HISTORY — PX: CORONARY PRESSURE/FFR STUDY: CATH118243

## 2024-02-12 LAB — POCT ACTIVATED CLOTTING TIME
Activated Clotting Time: 233 s
Activated Clotting Time: 262 s

## 2024-02-12 SURGERY — LEFT HEART CATH AND CORONARY ANGIOGRAPHY
Anesthesia: LOCAL

## 2024-02-12 MED ORDER — LIDOCAINE HCL (PF) 1 % IJ SOLN
INTRAMUSCULAR | Status: DC | PRN
  Administered 2024-02-12: 2 mL

## 2024-02-12 MED ORDER — VERAPAMIL HCL 2.5 MG/ML IV SOLN
INTRAVENOUS | Status: DC | PRN
  Administered 2024-02-12: 10 mL via INTRA_ARTERIAL

## 2024-02-12 MED ORDER — FENTANYL CITRATE (PF) 100 MCG/2ML IJ SOLN
INTRAMUSCULAR | Status: AC
Start: 2024-02-12 — End: 2024-02-12
  Filled 2024-02-12: qty 2

## 2024-02-12 MED ORDER — LABETALOL HCL 5 MG/ML IV SOLN: 10.0000 mg | INTRAVENOUS | Status: DC | PRN

## 2024-02-12 MED ORDER — HEPARIN SODIUM (PORCINE) 1000 UNIT/ML IJ SOLN
INTRAMUSCULAR | Status: DC | PRN
  Administered 2024-02-12: 3000 [IU] via INTRAVENOUS
  Administered 2024-02-12 (×2): 4500 [IU] via INTRAVENOUS

## 2024-02-12 MED ORDER — SODIUM CHLORIDE 0.9 % IV SOLN: 250.0000 mL | INTRAVENOUS | Status: DC | PRN

## 2024-02-12 MED ORDER — NITROGLYCERIN 1 MG/10 ML FOR IR/CATH LAB
INTRA_ARTERIAL | Status: DC | PRN
  Administered 2024-02-12: 200 ug via INTRACORONARY
  Administered 2024-02-12 (×2): 200 ug via INTRA_ARTERIAL

## 2024-02-12 MED ORDER — LIDOCAINE HCL (PF) 1 % IJ SOLN
INTRAMUSCULAR | Status: AC
  Filled 2024-02-12: qty 30

## 2024-02-12 MED ORDER — MIDAZOLAM HCL 2 MG/2ML IJ SOLN
INTRAMUSCULAR | Status: DC | PRN
  Administered 2024-02-12 (×2): 1 mg via INTRAVENOUS

## 2024-02-12 MED ORDER — ASPIRIN 81 MG PO CHEW: 81.0000 mg | CHEWABLE_TABLET | ORAL | Status: DC

## 2024-02-12 MED ORDER — FREE WATER: 500.0000 mL | Freq: Once | Status: DC

## 2024-02-12 MED ORDER — SODIUM CHLORIDE 0.9 % WEIGHT BASED INFUSION: 1.0000 mL/kg/h | INTRAVENOUS | Status: DC

## 2024-02-12 MED ORDER — IOHEXOL 350 MG/ML SOLN
INTRAVENOUS | Status: DC | PRN
  Administered 2024-02-12: 75 mL via INTRA_ARTERIAL

## 2024-02-12 MED ORDER — NITROGLYCERIN 1 MG/10 ML FOR IR/CATH LAB
INTRA_ARTERIAL | Status: AC
  Filled 2024-02-12: qty 10

## 2024-02-12 MED ORDER — FENTANYL CITRATE (PF) 100 MCG/2ML IJ SOLN
INTRAMUSCULAR | Status: DC | PRN
  Administered 2024-02-12 (×2): 25 ug via INTRAVENOUS

## 2024-02-12 MED ORDER — ADENOSINE 12 MG/4ML IV SOLN
INTRAVENOUS | Status: AC
  Filled 2024-02-12: qty 16

## 2024-02-12 MED ORDER — ONDANSETRON HCL 4 MG/2ML IJ SOLN: 4.0000 mg | Freq: Four times a day (QID) | INTRAMUSCULAR | Status: DC | PRN

## 2024-02-12 MED ORDER — MIDAZOLAM HCL 2 MG/2ML IJ SOLN
INTRAMUSCULAR | Status: AC
  Filled 2024-02-12: qty 2

## 2024-02-12 MED ORDER — ADENOSINE (DIAGNOSTIC) 140MCG/KG/MIN
INTRAVENOUS | Status: DC | PRN
  Administered 2024-02-12: 140 ug/kg/min via INTRAVENOUS

## 2024-02-12 MED ORDER — SODIUM CHLORIDE 0.9% FLUSH: 3.0000 mL | Freq: Two times a day (BID) | INTRAVENOUS | Status: DC

## 2024-02-12 MED ORDER — HEPARIN (PORCINE) IN NACL 1000-0.9 UT/500ML-% IV SOLN
INTRAVENOUS | Status: DC | PRN
  Administered 2024-02-12: 1000 mL via SURGICAL_CAVITY

## 2024-02-12 MED ORDER — HEPARIN SODIUM (PORCINE) 1000 UNIT/ML IJ SOLN
INTRAMUSCULAR | Status: AC
  Filled 2024-02-12: qty 10

## 2024-02-12 MED ORDER — ACETAMINOPHEN 325 MG PO TABS
650.0000 mg | ORAL_TABLET | ORAL | Status: DC | PRN
Start: 2024-02-12 — End: 2024-02-12

## 2024-02-12 MED ORDER — SODIUM CHLORIDE 0.9 % WEIGHT BASED INFUSION: 3.0000 mL/kg/h | INTRAVENOUS | Status: AC

## 2024-02-12 MED ORDER — VERAPAMIL HCL 2.5 MG/ML IV SOLN
INTRAVENOUS | Status: AC
  Filled 2024-02-12: qty 2

## 2024-02-12 MED ORDER — HYDRALAZINE HCL 20 MG/ML IJ SOLN
10.0000 mg | INTRAMUSCULAR | Status: DC | PRN
Start: 2024-02-12 — End: 2024-02-12

## 2024-02-12 MED ORDER — SODIUM CHLORIDE 0.9% FLUSH: 3.0000 mL | INTRAVENOUS | Status: DC | PRN

## 2024-02-12 SURGICAL SUPPLY — 10 items
CATH INFINITI AMBI 5FR TG (CATHETERS) IMPLANT
CATH VISTA GUIDE 6FR JR4 ECOPK (CATHETERS) IMPLANT
CATH VISTA GUIDE 6FR XB3.5 EPK (CATHETERS) IMPLANT
DEVICE RAD COMP TR BAND LRG (VASCULAR PRODUCTS) IMPLANT
GLIDESHEATH SLEND A-KIT 6F 22G (SHEATH) IMPLANT
GUIDEWIRE INQWIRE 1.5J.035X260 (WIRE) IMPLANT
GUIDEWIRE PRESSURE X 175 (WIRE) IMPLANT
KIT HEMO VALVE WATCHDOG (MISCELLANEOUS) IMPLANT
PACK CARDIAC CATHETERIZATION (CUSTOM PROCEDURE TRAY) ×1 IMPLANT
SET ATX-X65L (MISCELLANEOUS) IMPLANT

## 2024-02-12 NOTE — Research (Signed)
 Prevail Informed Consent   Subject Name: Joshua Manning  Subject met inclusion and exclusion criteria.  The informed consent form, study requirements and expectations were reviewed with the subject and questions and concerns were addressed prior to the signing of the consent form.  The subject verbalized understanding of the trial requirements.  The subject agreed to participate in the Prevail trial and signed the informed consent at 0800  on 02/12/2024.  The informed consent was obtained prior to performance of any protocol-specific procedures for the subject.  A copy of the signed informed consent was given to the subject and a copy was placed in the subject's medical record.   Joshua Manning   Screenfail

## 2024-02-12 NOTE — Interval H&P Note (Signed)
 History and Physical Interval Note:  02/12/2024 9:21 AM  Joshua Manning  has presented today for surgery, with the diagnosis of CAD.  The various methods of treatment have been discussed with the patient and family. After consideration of risks, benefits and other options for treatment, the patient has consented to  Procedure(s): LEFT HEART CATH AND CORONARY ANGIOGRAPHY (N/A) as a surgical intervention.  The patient's history has been reviewed, patient examined, no change in status, stable for surgery.  I have reviewed the patient's chart and labs.  Questions were answered to the patient's satisfaction.     Marvel Mcphillips J Delaney Schnick

## 2024-02-12 NOTE — Telephone Encounter (Signed)
 Called patient advised of below they verbalized understanding.

## 2024-02-12 NOTE — Telephone Encounter (Signed)
-----   Message from Rollo FABIENE Louder sent at 02/11/2024  7:03 AM EDT ----- Please tell patient that his lab work is stable. OK to proceed with cath  ----- Message ----- From: Interface, [facility] Lab Results In Sent: 02/08/2024  11:36 PM EDT To: Rollo JONELLE Louder, PA-C

## 2024-02-12 NOTE — Discharge Instructions (Signed)
 Radial Site Care  This sheet gives you information about how to care for yourself after your procedure. Your health care provider may also give you more specific instructions. If you have problems or questions, contact your health care provider. What can I expect after the procedure? After the procedure, it is common to have: Bruising and tenderness at the catheter insertion area. Follow these instructions at home: Medicines Take over-the-counter and prescription medicines only as told by your health care provider. Insertion site care Follow instructions from your health care provider about how to take care of your insertion site. Make sure you: Wash your hands with soap and water before you remove your bandage (dressing). If soap and water are not available, use hand sanitizer. May remove dressing in 24 hours. Check your insertion site every day for signs of infection. Check for: Redness, swelling, or pain. Fluid or blood. Pus or a bad smell. Warmth. Do no take baths, swim, or use a hot tub for 5 days. You may shower 24-48 hours after the procedure. Remove the dressing and gently wash the site with plain soap and water. Pat the area dry with a clean towel. Do not rub the site. That could cause bleeding. Do not apply powder or lotion to the site. Activity  For 24 hours after the procedure, or as directed by your health care provider: Do not flex or bend the affected arm. Do not push or pull heavy objects with the affected arm. Do not drive yourself home from the hospital or clinic. You may drive 24 hours after the procedure. Do not operate machinery or power tools. KEEP ARM ELEVATED THE REMAINDER OF THE DAY. Do not push, pull or lift anything that is heavier than 10 lb for 5 days. Ask your health care provider when it is okay to: Return to work or school. Resume usual physical activities or sports. Resume sexual activity. General instructions If the catheter site starts to  bleed, raise your arm and put firm pressure on the site. If the bleeding does not stop, get help right away. This is a medical emergency. DRINK PLENTY OF FLUIDS FOR THE NEXT 2-3 DAYS. No alcohol consumption for 24 hours after receiving sedation. If you went home on the same day as your procedure, a responsible adult should be with you for the first 24 hours after you arrive home. Keep all follow-up visits as told by your health care provider. This is important. Contact a health care provider if: You have a fever. You have redness, swelling, or yellow drainage around your insertion site. Get help right away if: You have unusual pain at the radial site. The catheter insertion area swells very fast. The insertion area is bleeding, and the bleeding does not stop when you hold steady pressure on the area. Your arm or hand becomes pale, cool, tingly, or numb. These symptoms may represent a serious problem that is an emergency. Do not wait to see if the symptoms will go away. Get medical help right away. Call your local emergency services (911 in the [place] Do not drive yourself to the hospital. Summary After the procedure, it is common to have bruising and tenderness at the site. Follow instructions from your health care provider about how to take care of your radial site wound. Check the wound every day for signs of infection.  This information is not intended to replace advice given to you by your health care provider. Make sure you discuss any questions you have with  your health care provider. Document Revised: 08/08/2017 Document Reviewed: 08/08/2017 Elsevier Patient Education  2020 [employer].

## 2024-02-12 NOTE — H&P (Signed)
 OV 01/21/2024 copied for documentation  Cardiology Office Note   Date:  02/12/2024  ID:  Joshua Manning, Joshua Manning 10-06-1965, MRN [id number] PCP: Katrinka Garnette KIDD, MD  [facility] Providers Cardiologist:  None Cardiology APP:  Vicci Rollo SAUNDERS, PA-C   History of Present Illness Joshua Manning is a 58 y.o. male with past medical history of hypertension, aortic atherosclerosis, emphysema, tobacco use disorder, hyperlipidemia.  Presents today for evaluation of chest pain.  Patient was seen in the ED on 6/11 for evaluation of chest pain.  Pain was worse with deep inhalation. EKG showed normal sinus rhythm with heart rate 92 bpm, left axis deviation, possible anterior infarct, heart rate 92 bpm. Chest x-ray showed hyperinflation, no acute cardiopulmonary disease.  High-sensitivity troponin 16,14.  K4.4, creatinine 0.72, WBC 14.5, hemoglobin 14.8.  CTA showed no aortic dissection or aneurysm, no PE.  Patient was discharged from the ED with instructions to follow-up with PCP.  He saw his PCP on 6/17.  At that time, patient had some improvement in symptoms with Protonix .  Also had some chest wall pain so could be possible costochondritis.  With patient's cardiac risk factors including smoking history, hyperlipidemia, aortic atherosclerosis, hypertension, he was referred to cardiology. An echocardiogram was ordered by has not been completed   Today patient reports that since being seen in the ER on 6/11, he has continued to have episodes of chest pain.  He has also had shortness of breath on exertion.  He has of both sharp chest pain and chest tightness, chest pain occasionally radiates to his back. Sometimes these symptoms occur together, but they can also occur separately.  Chest pain seems to happen more when he is at rest.  Episodes last around 30 minutes - 1 hour before resolving on their own.  Patient has also been having shortness of breath on exertion.  Denies orthopnea, lower extremity  swelling.  Denies dizziness, syncope, near syncope.  Occasionally he has brief episodes of palpitations.  When he was seen in the ER on 6/11, he noted that his blood pressure was very elevated.  In addition his chest pain he also had headache and jaw pain at that time.  He has since had a few recurrences of headache and right jaw pain.  When this occurs, he checks his blood pressure.  His blood pressure has predominantly been in the 140s/80s.  At times can get into the 150s systolic.   Patient denies family history of cardiac problems.  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 His PCP ordered an echocardiogram which is scheduled for 7/9   Studies Reviewed  Risk Assessment/Calculations           Physical Exam VS:  BP 134/81   Pulse 82   Temp 98.8 F (37.1 C)   Resp 16   Ht 5' 9 (1.753 m)   Wt 88 kg   SpO2 96%   BMI 28.65 kg/m        Wt Readings from Last 3 Encounters:  02/12/24 88 kg  01/29/24 88.1 kg  01/21/24 87.4 kg    GEN: Well nourished, well developed in no acute distress. Sitting comfortably in the chair  NECK: No JVD  CARDIAC:  RRR, no murmurs, rubs, gallops. Radial pulses 2+ bilaterally  RESPIRATORY:  Clear to auscultation without rales, wheezing or rhonchi. Normal WOB on room air   ABDOMEN: Soft, non-tender, non-distended EXTREMITIES:  No edema  in BLE; No deformity   ASSESSMENT AND PLAN  Chest pain  DOE  - Patient was seen in the ED on 6/11 with chest pain that radiated to his jaw and caused headache.  Chest pain also radiated to his back.  High-sensitivity troponin 16, 14. EKG without ischemic changes.  CTA chest/ab/pelvis showed no evidence of aortic dissection or aneurysm, no acute intrathoracic abdominal or pelvic abnormality. No PE seen (though imaging was focused on aneurysm rule out).  Did note multivessel coronary vascular calcification. - Patient reports that he continues to have chest discomfort  and DOE.  Chest pain seems to occur when he was at rest.  Is not exertional. Can occur when he is emotionally stressed. Episodes occur randomly and resolve on their own after 30 minutes or so  - Patient was seen by his PCP in follow-up.  An echocardiogram was ordered and is scheduled for 7/9. - Ordered coronary CTA  - Ordered BMP prior to scan  - BP has continued to be elevated at times. Possible this is contributing to chest discomfort. Start amlodipine  5 mg daily for BP control and antianginal benefit   HTN  - Patient reports that when he was seen in the ED 6/11, his BP was very elevated up to 190s systolic. BP has improved, but continues to be elevated at times. Reports BP predominantly in the 140s/80s, but can get into the 150s systolic  - Start amlodipine  5 mg daily  - Continue valsartan  80 mg daily  - BMP ordered as above   Palpitations - Patient occasionally has brief episodes of palpitations. They are rather mild.  - Offered cardiac monitor but we agreed to hold off for now  Tobacco use  - Counseled on tobacco cessation and provided resources/information on AVS    HLD  - LDL 82 in 07/2023 - Continue crestor  20 mg daily for now. Pending coronary CTA results, may increase in the future   ADDENDUM 7/23- Coronary CTA completed, FFR was positive for a flow limiting lesion in the proximal RCA. Arranged cath for further evaluation. See phone note 7/23 for full consent   Dispo: Follow up in 3 months with Dr. Elmira   Signed, Newman JINNY Elmira, MD

## 2024-02-13 ENCOUNTER — Other Ambulatory Visit: Payer: Self-pay

## 2024-02-13 ENCOUNTER — Encounter (HOSPITAL_COMMUNITY): Payer: Self-pay | Admitting: Cardiology

## 2024-02-13 MED ORDER — PANTOPRAZOLE SODIUM 40 MG PO TBEC
40.0000 mg | DELAYED_RELEASE_TABLET | Freq: Every day | ORAL | 3 refills | Status: DC
Start: 2024-02-13 — End: 2024-04-28

## 2024-04-06 ENCOUNTER — Other Ambulatory Visit: Payer: Self-pay | Admitting: Cardiology

## 2024-04-17 ENCOUNTER — Other Ambulatory Visit: Payer: Self-pay | Admitting: Cardiology

## 2024-04-28 ENCOUNTER — Ambulatory Visit: Payer: Self-pay | Attending: Cardiology | Admitting: Cardiology

## 2024-04-28 ENCOUNTER — Encounter: Payer: Self-pay | Admitting: Cardiology

## 2024-04-28 VITALS — BP 122/76 | HR 97 | Resp 16 | Ht 69.0 in | Wt 194.4 lb

## 2024-04-28 DIAGNOSIS — I2510 Atherosclerotic heart disease of native coronary artery without angina pectoris: Secondary | ICD-10-CM | POA: Diagnosis not present

## 2024-04-28 DIAGNOSIS — I10 Essential (primary) hypertension: Secondary | ICD-10-CM

## 2024-04-28 DIAGNOSIS — E782 Mixed hyperlipidemia: Secondary | ICD-10-CM

## 2024-04-28 NOTE — Patient Instructions (Signed)

## 2024-04-28 NOTE — Progress Notes (Signed)
 Cardiology Office Note:  .   Date:  04/28/2024  ID:  Joshua Manning, DOB [birth year], MRN [id number] PCP: Katrinka Garnette KIDD, MD  [facility] Providers Cardiologist:  Newman Lawrence, MD PCP: Katrinka Garnette KIDD, MD  Chief Complaint  Patient presents with   Hypertension   chest pain   Follow-up     Joshua Manning is a 58 y.o. male with hypertension, hyperlipidemia, CAD  History of Present Illness  Patient continues to have episodes of chest tightness at rest that last from anywhere to few minutes to 30 minutes.  He does not have any exertional chest pain with walking as much as 3 to 4 miles at work.  Episodes of chest pain at rest resolve on its own.  Coronary CT angiogram high-grade suspicion of obstructive CAD in RCA.  However, coronary angiogram showed that severity of CAD was likely overestimated due to calcification.  I do not see any evidence of obstructive coronary artery disease or microvascular function on heart catheterization, although he does have moderate coronary artery disease.     Vitals:   04/28/24 1252  BP: 122/76  Pulse: 97  Resp: 16  SpO2: 96%      Review of Systems  Cardiovascular:  Positive for chest pain. Negative for dyspnea on exertion, leg swelling, palpitations and syncope.        Studies Reviewed: SABRA        Coronary angiography 02/12/2024: LM: Normal LAD: Mid 40% disease Lcx: No significant disease RCA: Prox 60%, mid 40% disease with moderate calcification   LVEDP 3 mmHg      Coronary physiology testing (RCA):  Guide catheter: 6 Fr JR 4 Normalization performed in aorta IC Nitroglycerin : 200 mcg RFR: 0.95   Coronary physiology testing (LAD):  Guide catheter: 6 Fr XB 3.5 Normalization performed in left main coronary artery. IC Nitroglycerin : 200 mcg Resting Pd/Pa: 0.89 FFR: 0.82 CFR: 4.5 IMR: 22 There was negative drift in the wire that could amount to low resting Pd/Pa numbers, but confidence is high that  these are not physiologically significant   Conclusion: Moderate nonobstructive coronary artery disease No evidence of microvascular dysfunction   Given his moderate coronary artery disease, it would be reasonable to add beta blocker to see if there is any improvement, but if not, I would suspect that this is noncardiac chest pain    Labs 01/2024: Hb 14.3 Cr 0.73, Na 132  Independently interpreted 07/2023: Chol 176, TG 177, HDL 58, LDL 82 HbA1C 6.0% Hb 13.8 TSH 1.1    Physical Exam Vitals and nursing note reviewed.  Constitutional:      General: He is not in acute distress. Neck:     Vascular: No JVD.  Cardiovascular:     Rate and Rhythm: Normal rate and regular rhythm.     Heart sounds: Normal heart sounds. No murmur heard. Pulmonary:     Effort: Pulmonary effort is normal.     Breath sounds: Normal breath sounds. No wheezing or rales.  Musculoskeletal:     Right lower leg: No edema.     Left lower leg: No edema.      VISIT DIAGNOSES:   ICD-10-CM   1. Coronary artery disease involving native coronary artery of native heart without angina pectoris  I25.10     2. Mixed hyperlipidemia  E78.2     3. Essential hypertension  I10        Joshua Manning is a 58 y.o. male with hypertension, hyperlipidemia, CAD  Assessment & Plan  CAD: Moderate nonobstructive coronary artery disease, without any severe epicardial stenosis or coronary microvascular dysfunction. He does have moderate CAD, his resting chest pain is noncardiac in etiology. Suspect musculoskeletal or GERD etiology. Continue aspirin , statin, hypertension control. LDL goal <70.  He has upcoming labs with PCP Dr. Katrinka in next few weeks.  If LDL remains >70, could increase Crestor  to 40 mg daily.  Hypertension: Well-controlled on valsartan  and amlodipine .   F/u in 6 months  Signed, Newman JINNY Lawrence, MD

## 2024-07-11 ENCOUNTER — Other Ambulatory Visit: Payer: Self-pay | Admitting: Family Medicine

## 2024-07-12 ENCOUNTER — Other Ambulatory Visit: Payer: Self-pay | Admitting: Cardiology

## 2024-07-29 ENCOUNTER — Other Ambulatory Visit: Payer: Self-pay | Admitting: Medical Genetics

## 2024-08-04 ENCOUNTER — Ambulatory Visit: Admitting: Family Medicine

## 2024-08-04 ENCOUNTER — Encounter: Payer: Self-pay | Admitting: Family Medicine

## 2024-08-04 ENCOUNTER — Ambulatory Visit: Payer: Self-pay | Admitting: Family Medicine

## 2024-08-04 VITALS — BP 132/78 | HR 91 | Temp 98.4°F | Ht 69.0 in | Wt 198.0 lb

## 2024-08-04 DIAGNOSIS — E785 Hyperlipidemia, unspecified: Secondary | ICD-10-CM

## 2024-08-04 DIAGNOSIS — Z125 Encounter for screening for malignant neoplasm of prostate: Secondary | ICD-10-CM

## 2024-08-04 DIAGNOSIS — F17200 Nicotine dependence, unspecified, uncomplicated: Secondary | ICD-10-CM

## 2024-08-04 DIAGNOSIS — R739 Hyperglycemia, unspecified: Secondary | ICD-10-CM | POA: Diagnosis not present

## 2024-08-04 DIAGNOSIS — Z0000 Encounter for general adult medical examination without abnormal findings: Secondary | ICD-10-CM | POA: Diagnosis not present

## 2024-08-04 DIAGNOSIS — D7389 Other diseases of spleen: Secondary | ICD-10-CM

## 2024-08-04 DIAGNOSIS — Z131 Encounter for screening for diabetes mellitus: Secondary | ICD-10-CM

## 2024-08-04 LAB — CBC WITH DIFFERENTIAL/PLATELET
Basophils Absolute: 0.1 K/uL (ref 0.0–0.1)
Basophils Relative: 0.8 % (ref 0.0–3.0)
Eosinophils Absolute: 0.2 K/uL (ref 0.0–0.7)
Eosinophils Relative: 1.5 % (ref 0.0–5.0)
HCT: 42.3 % (ref 39.0–52.0)
Hemoglobin: 14.6 g/dL (ref 13.0–17.0)
Lymphocytes Relative: 21 % (ref 12.0–46.0)
Lymphs Abs: 2.3 K/uL (ref 0.7–4.0)
MCHC: 34.5 g/dL (ref 30.0–36.0)
MCV: 97.2 fl (ref 78.0–100.0)
Monocytes Absolute: 1 K/uL (ref 0.1–1.0)
Monocytes Relative: 8.9 % (ref 3.0–12.0)
Neutro Abs: 7.6 K/uL (ref 1.4–7.7)
Neutrophils Relative %: 67.8 % (ref 43.0–77.0)
Platelets: 332 K/uL (ref 150.0–400.0)
RBC: 4.36 Mil/uL (ref 4.22–5.81)
RDW: 12.7 % (ref 11.5–15.5)
WBC: 11.2 K/uL — ABNORMAL HIGH (ref 4.0–10.5)

## 2024-08-04 LAB — COMPREHENSIVE METABOLIC PANEL WITH GFR
ALT: 48 U/L (ref 3–53)
AST: 24 U/L (ref 5–37)
Albumin: 4.8 g/dL (ref 3.5–5.2)
Alkaline Phosphatase: 42 U/L (ref 39–117)
BUN: 7 mg/dL (ref 6–23)
CO2: 27 meq/L (ref 19–32)
Calcium: 9.8 mg/dL (ref 8.4–10.5)
Chloride: 96 meq/L (ref 96–112)
Creatinine, Ser: 0.67 mg/dL (ref 0.40–1.50)
GFR: 102.26 mL/min
Glucose, Bld: 99 mg/dL (ref 70–99)
Potassium: 4.3 meq/L (ref 3.5–5.1)
Sodium: 132 meq/L — ABNORMAL LOW (ref 135–145)
Total Bilirubin: 0.4 mg/dL (ref 0.2–1.2)
Total Protein: 7.3 g/dL (ref 6.0–8.3)

## 2024-08-04 LAB — LIPID PANEL
Cholesterol: 171 mg/dL (ref 28–200)
HDL: 60.8 mg/dL
LDL Cholesterol: 72 mg/dL (ref 10–99)
NonHDL: 110.01
Total CHOL/HDL Ratio: 3
Triglycerides: 190 mg/dL — ABNORMAL HIGH (ref 10.0–149.0)
VLDL: 38 mg/dL (ref 0.0–40.0)

## 2024-08-04 LAB — PSA: PSA: 1.19 ng/mL (ref 0.10–4.00)

## 2024-08-04 LAB — HEMOGLOBIN A1C: Hgb A1c MFr Bld: 5.9 % (ref 4.6–6.5)
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
## 2024-08-04 NOTE — Patient Instructions (Addendum)
 Tetanus, Diphtheria, and Pertussis (Tdap) here or at pharmacy when feeling better- I would overdose this one because of how active you are with work  Please stop by lab before you go If you have mychart- we will send your results within 3 business days of us  receiving them.  If you do not have mychart- we will call you about results within 5 business days of us  receiving them.  *please also note that you will see labs on mychart as soon as they post. I will later go in and write notes on them- will say notes from Dr. Katrinka   Recommended follow up: Return in about 6 months (around 02/01/2025) for followup or sooner if needed.Schedule b4 you leave.

## 2024-08-05 ENCOUNTER — Other Ambulatory Visit: Payer: Self-pay | Admitting: Family Medicine

## 2024-08-05 LAB — URINALYSIS, ROUTINE W REFLEX MICROSCOPIC
Bilirubin Urine: NEGATIVE
Hgb urine dipstick: NEGATIVE
Ketones, ur: NEGATIVE
Leukocytes,Ua: NEGATIVE
Nitrite: NEGATIVE
RBC / HPF: NONE SEEN
Specific Gravity, Urine: 1.01 (ref 1.000–1.030)
Total Protein, Urine: NEGATIVE
Urine Glucose: NEGATIVE
Urobilinogen, UA: 0.2 (ref 0.0–1.0)
pH: 7 (ref 5.0–8.0)

## 2024-08-05 MED ORDER — ROSUVASTATIN CALCIUM 40 MG PO TABS: 40.0000 mg | ORAL_TABLET | Freq: Every day | ORAL | 3 refills | Status: AC

## 2024-08-13 ENCOUNTER — Other Ambulatory Visit

## 2024-08-28 ENCOUNTER — Other Ambulatory Visit
